# Patient Record
Sex: Male | Born: 1990 | Race: White | Hispanic: No | Marital: Single
Health system: Southern US, Community
[De-identification: ages and names within clinical notes are randomized; demographics above are authoritative.]

## PROBLEM LIST (undated history)

## (undated) DIAGNOSIS — F909 Attention-deficit hyperactivity disorder, unspecified type: Secondary | ICD-10-CM

---

## 2017-12-19 ENCOUNTER — Encounter: Payer: Self-pay | Admitting: Cardiology

## 2017-12-19 ENCOUNTER — Ambulatory Visit (INDEPENDENT_AMBULATORY_CARE_PROVIDER_SITE_OTHER): Payer: Managed Care, Other (non HMO) | Admitting: Cardiology

## 2017-12-19 VITALS — BP 132/78 | HR 81 | Ht 72.0 in | Wt 208.0 lb

## 2017-12-19 DIAGNOSIS — R55 Syncope and collapse: Secondary | ICD-10-CM

## 2017-12-19 DIAGNOSIS — R079 Chest pain, unspecified: Secondary | ICD-10-CM | POA: Diagnosis not present

## 2017-12-19 DIAGNOSIS — Z01812 Encounter for preprocedural laboratory examination: Secondary | ICD-10-CM

## 2017-12-19 MED ORDER — METOPROLOL TARTRATE 50 MG PO TABS: ORAL_TABLET | ORAL | 0 refills | Status: DC

## 2017-12-19 NOTE — Progress Notes (Signed)
Cardiology Office Note:    Date:  12/19/2017   ID:  Jeff Mayo, DOB Sep 08, 1990, MRN [id number]  PCP:  Nechama Guard, FNP  Cardiologist:  Jodelle Red, MD PhD  Referring MD: Nechama Guard, FNP   Chief Complaint  Patient presents with  . Chest Pain    History of Present Illness:    Jeff Mayo is a 27 y.o. male with a hx of palpitations who is seen as a new consult at the request of Nechama Guard, FNP for the evaluation and management of palpitations/chest pain.  Patient history: endorses an episode of syncope concerning for cardiac nature. Was at basic training for the [employer], was running outside in [place] in July 2016. After running for about 28 minutes, he started having arm pain, then chest pain. Shortly after he collapsed. He does not know how long he was out. He landed on his face on the pavement. The first thing he remembers was someone rolling him onto his back to start CPR, but he had a pulse. Does not think he received CPR or defibrillation. He was transported to several hospitals in [place] but does not think he had any major testing at the time. Never had a heart cath. Has been unable to get records, was never told what the cause was. Several months later, he had a private cardiologist perform a stress test and echo, was told it was normal. Has never had a similar episode before or since. No family history of sudden cardiac death or syncope. Has no family history of MI, heart disease, or CVA.  Current concerns: gets chest pain, feels like "a burp in my heart." Happens frequently, nonexertional. Not associated with nausea, vomiting, diaphoresis. Sometimes has left side pain with activity, sharp, radiates around rib cage. Recently, has been coughing up green phlegm. Has been undergoing workup for acid reflux. Also has sensation that all of the blood in his body is hot (has been chronic) and that sometimes he feels like there is something moving up and down in the  veins in his upper arms.  History reviewed. No pertinent past medical history.  History reviewed. No pertinent surgical history.  Current Medications: Current Outpatient Medications on File Prior to Visit  Medication Sig  . cetirizine (ZYRTEC) 10 MG tablet Take 10 mg by mouth daily.  Marland Kitchen omeprazole (PRILOSEC) 40 MG capsule Take 40 mg by mouth daily.   No current facility-administered medications on file prior to visit.      Allergies:   Penicillins   Social History   Socioeconomic History  . Marital status: Single    Spouse name: Not on file  . Number of children: Not on file  . Years of education: Not on file  . Highest education level: Not on file  Occupational History  . Not on file  Social Needs  . Financial resource strain: Not on file  . Food insecurity:    Worry: Not on file    Inability: Not on file  . Transportation needs:    Medical: Not on file    Non-medical: Not on file  Tobacco Use  . Smoking status: Never Smoker  . Smokeless tobacco: Never Used  Substance and Sexual Activity  . Alcohol use: Never    Frequency: Never  . Drug use: Never  . Sexual activity: Not on file  Lifestyle  . Physical activity:    Days per week: Not on file    Minutes per session: Not on file  .  Stress: Not on file  Relationships  . Social connections:    Talks on phone: Not on file    Gets together: Not on file    Attends religious service: Not on file    Active member of club or organization: Not on file    Attends meetings of clubs or organizations: Not on file    Relationship status: Not on file  Other Topics Concern  . Not on file  Social History Narrative  . 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 Denies tobacco or illicits. Going to school for engineering now.  Family History: The patient's family history includes Bipolar disorder in his father; Diabetes in his other. No significant cardiac  history.  ROS:   Please see the history of present illness.  Additional pertinent ROS: Review of Systems  Constitutional: Negative for chills, diaphoresis, fever and malaise/fatigue.  HENT: Negative for ear pain and hearing loss.   Eyes: Negative for blurred vision and photophobia.  Respiratory: Positive for sputum production. Negative for hemoptysis and shortness of breath.   Cardiovascular: Positive for chest pain. Negative for palpitations, orthopnea, claudication, leg swelling and PND.  Gastrointestinal: Positive for abdominal pain, blood in stool and constipation. Negative for melena.       Hemorrhoids  Genitourinary: Negative for dysuria and hematuria.  Musculoskeletal: Negative for falls and myalgias.  Skin: Negative for itching and rash.  Neurological: Negative for focal weakness and loss of consciousness.  Endo/Heme/Allergies: Does not bruise/bleed easily.    EKGs/Labs/Other Studies Reviewed:    The following studies were reviewed today: Prior notes, ECG  EKG:  EKG is ordered today.  The ekg ordered today demonstrates normal sinus rhythm  Recent Labs: No results found for requested labs within last 8760 hours.  Recent Lipid Panel No results found for: CHOL, TRIG, HDL, CHOLHDL, VLDL, LDLCALC, LDLDIRECT  Physical Exam:    VS:  BP 132/78   Pulse 81   Ht 6' (1.829 m)   Wt 208 lb (94.3 kg)   BMI 28.21 kg/m     Wt Readings from Last 3 Encounters:  12/19/17 208 lb (94.3 kg)     GEN: Well nourished, well developed in no acute distress HEENT: Normal NECK: No JVD; No carotid bruits LYMPHATICS: No lymphadenopathy CARDIAC: regular rhythm, normal S1 and S2, no murmurs, rubs, gallops. Radial and DP pulses 2+ bilaterally. RESPIRATORY:  Clear to auscultation without rales, wheezing or rhonchi  ABDOMEN: Soft, non-tender, non-distended MUSCULOSKELETAL:  No edema; No deformity  SKIN: Warm and dry NEUROLOGIC:  Alert and oriented x 3 PSYCHIATRIC:  Normal affect    ASSESSMENT:    1. Chest pain, unspecified type   2. Syncope, unspecified syncope type   3. Pre-operative laboratory examination    PLAN:    1. History of syncope after chest pain, recurrent chest pain/discomfort Current symptoms are much different than prior, but his presentation is extremely concerning. Would not expect severe chest/arm pain leading to syncope, even in a setting that could lead to dehydration, etc. He has never been told what the etiology of his episode was. -ECG today unremarkable. Concern is for congenital/structural disease, anomalous coronary, etc as a cause of syncope. Will order echo to evaluate for structural heart disease, coronary CTA to evaluate for cause of chest pain (dissection, anomalous course, etc). -if initial workup unrevealing, would consider cardiac MRI to look for scar. Treadmill stress may also show any abnormal changes  in electrical rhythm with exercise  While he did not require CPR/defibrillation (to his knowledge), this is a concerning syncopal event. Not technically sudden cardiac death. No clear risk factors or family history. He will try to get records from the [employer] but has been unsuccessful to this point.  Plan for follow up: 3-4 months or sooner depending on results of testing  Medication Adjustments/Labs and Tests Ordered: Current medicines are reviewed at length with the patient today.  Concerns regarding medicines are outlined above.  Orders Placed This Encounter  Procedures  . CT CORONARY MORPH W/CTA COR W/SCORE W/CA W/CM &/OR WO/CM  . CT CORONARY FRACTIONAL FLOW RESERVE DATA PREP  . CT CORONARY FRACTIONAL FLOW RESERVE FLUID ANALYSIS  . Basic metabolic panel  . EKG 12-Lead  . ECHOCARDIOGRAM COMPLETE   Meds ordered this encounter  Medications  . metoprolol tartrate (LOPRESSOR) 50 MG tablet    Sig: TAKE 1 TABLET 1 HR PRIOR TO CARDIAC PROCEDURE    Dispense:  1 tablet    Refill:  0    Patient Instructions  Medication  Instructions: Your physician recommends that you continue on your current medications as directed.    If you need a refill on your cardiac medications before your next appointment, please call your pharmacy.   Labwork: Your physician recommends that you return for lab work 1 week prior to test. BMP   Procedures/Testing: Your physician has requested that you have an echocardiogram. Echocardiography is a painless test that uses sound waves to create images of your heart. It provides your doctor with information about the size and shape of your heart and how well your heart's chambers and valves are working. This procedure takes approximately one hour. There are no restrictions for this procedure. [address]  Your physician has requested that you have cardiac CT. Cardiac computed tomography (CT) is a painless test that uses an x-ray machine to take clear, detailed pictures of your heart. For further information please visit [email or web address]. Please follow instruction sheet as given. [facility]     Follow-Up: Your physician wants you to follow-up in 3-4 months with Dr. Cristal Deerhristopher.  Special Instructions:    Thank you for choosing [facility] at [facility]!!    Please arrive at the [facility] main entrance of [facility] at xx:xx AM (30-45 minutes prior to test start time)  [facility] [address] [place], [place] [address] [phone]  Proceed to the [facility] Radiology Department (First Floor).  Please follow these instructions carefully (unless otherwise directed):   On the Night Before the Test: . Drink plenty of water. . Do not consume any caffeinated/decaffeinated beverages or chocolate 12 hours prior to your test. . Do not take any antihistamines 12 hours prior to your test.  On the Day of the Test: . Drink plenty of water. Do not drink any water within one hour of the test. . Do not eat any food 4 hours  prior to the test. . You may take your regular medications prior to the test. . IF NOT ON A BETA BLOCKER - Take 50 mg of lopressor (metoprolol) one hour before the test.  After the Test: . Drink plenty of water. . After receiving IV contrast, you may experience a mild flushed feeling. This is normal. . On occasion, you may experience a mild rash up to 24 hours after the test. This is not dangerous. If this occurs, you can take Benadryl 25 mg and  increase your fluid intake. . If you experience trouble breathing, this can be serious. If it is severe call 911 IMMEDIATELY. If it is mild, please call our office.      Signed, Jodelle Red, MD PhD 12/19/2017 5:42 PM    [facility]

## 2017-12-19 NOTE — Patient Instructions (Signed)
Medication Instructions: Your physician recommends that you continue on your current medications as directed.    If you need a refill on your cardiac medications before your next appointment, please call your pharmacy.   Labwork: Your physician recommends that you return for lab work 1 week prior to test. BMP   Procedures/Testing: Your physician has requested that you have an echocardiogram. Echocardiography is a painless test that uses sound waves to create images of your heart. It provides your doctor with information about the size and shape of your heart and how well your heart's chambers and valves are working. This procedure takes approximately one hour. There are no restrictions for this procedure. [address]  Your physician has requested that you have cardiac CT. Cardiac computed tomography (CT) is a painless test that uses an x-ray machine to take clear, detailed pictures of your heart. For further information please visit [email or web address]. Please follow instruction sheet as given. [facility]     Follow-Up: Your physician wants you to follow-up in 3-4 months with Dr. Cristal Deerhristopher.  Special Instructions:    Thank you for choosing [facility] at [facility]!!    Please arrive at the [facility] main entrance of [facility] at xx:xx AM (30-45 minutes prior to test start time)  [facility] [address] [place], [place] [address] [phone]  Proceed to the [facility] Radiology Department (First Floor).  Please follow these instructions carefully (unless otherwise directed):   On the Night Before the Test: . Drink plenty of water. . Do not consume any caffeinated/decaffeinated beverages or chocolate 12 hours prior to your test. . Do not take any antihistamines 12 hours prior to your test.  On the Day of the Test: . Drink plenty of water. Do not drink any water within one hour of the test. . Do not eat any food  4 hours prior to the test. . You may take your regular medications prior to the test. . IF NOT ON A BETA BLOCKER - Take 50 mg of lopressor (metoprolol) one hour before the test.  After the Test: . Drink plenty of water. . After receiving IV contrast, you may experience a mild flushed feeling. This is normal. . On occasion, you may experience a mild rash up to 24 hours after the test. This is not dangerous. If this occurs, you can take Benadryl 25 mg and increase your fluid intake. . If you experience trouble breathing, this can be serious. If it is severe call 911 IMMEDIATELY. If it is mild, please call our office.

## 2017-12-28 ENCOUNTER — Other Ambulatory Visit (HOSPITAL_COMMUNITY): Payer: Managed Care, Other (non HMO)

## 2018-01-04 ENCOUNTER — Ambulatory Visit (HOSPITAL_COMMUNITY): Payer: Managed Care, Other (non HMO) | Attending: Cardiology

## 2018-01-04 ENCOUNTER — Other Ambulatory Visit: Payer: Self-pay

## 2018-01-04 DIAGNOSIS — R55 Syncope and collapse: Secondary | ICD-10-CM

## 2018-01-04 DIAGNOSIS — R079 Chest pain, unspecified: Secondary | ICD-10-CM

## 2018-01-10 ENCOUNTER — Telehealth: Payer: Self-pay | Admitting: Cardiology

## 2018-01-10 NOTE — Telephone Encounter (Signed)
New message    Please call patient with echo results thank you

## 2018-01-10 NOTE — Telephone Encounter (Signed)
Patient aware of echo results and verbalized understanding.  

## 2018-02-01 ENCOUNTER — Ambulatory Visit (HOSPITAL_COMMUNITY): Payer: Managed Care, Other (non HMO)

## 2018-02-22 ENCOUNTER — Ambulatory Visit (HOSPITAL_COMMUNITY): Admission: RE | Admit: 2018-02-22 | Payer: Managed Care, Other (non HMO) | Source: Ambulatory Visit

## 2018-02-22 ENCOUNTER — Ambulatory Visit (HOSPITAL_COMMUNITY): Payer: Managed Care, Other (non HMO) | Attending: Cardiology

## 2018-03-14 ENCOUNTER — Ambulatory Visit (HOSPITAL_COMMUNITY): Payer: Managed Care, Other (non HMO)

## 2018-03-15 ENCOUNTER — Ambulatory Visit: Payer: Managed Care, Other (non HMO) | Admitting: Cardiology

## 2018-03-28 ENCOUNTER — Ambulatory Visit (HOSPITAL_COMMUNITY): Admission: RE | Admit: 2018-03-28 | Payer: Managed Care, Other (non HMO) | Source: Ambulatory Visit

## 2018-03-28 ENCOUNTER — Ambulatory Visit (HOSPITAL_COMMUNITY)
Admission: RE | Admit: 2018-03-28 | Discharge: 2018-03-28 | Disposition: A | Discharge status: Home / Self Care | Payer: Managed Care, Other (non HMO) | Source: Ambulatory Visit | Attending: Cardiology | Admitting: Cardiology

## 2018-03-28 DIAGNOSIS — R55 Syncope and collapse: Secondary | ICD-10-CM | POA: Diagnosis present

## 2018-03-28 DIAGNOSIS — R079 Chest pain, unspecified: Secondary | ICD-10-CM | POA: Insufficient documentation

## 2018-03-28 MED ORDER — IOPAMIDOL (ISOVUE-370) INJECTION 76%
100.0000 mL | Freq: Once | INTRAVENOUS | Status: AC | PRN
  Administered 2018-03-28: 100 mL via INTRAVENOUS

## 2018-03-28 MED ORDER — METOPROLOL TARTRATE 5 MG/5ML IV SOLN
5.0000 mg | INTRAVENOUS | Status: DC | PRN
  Administered 2018-03-28: 5 mg via INTRAVENOUS
  Filled 2018-03-28: qty 5

## 2018-03-28 MED ORDER — METOPROLOL TARTRATE 5 MG/5ML IV SOLN
INTRAVENOUS | Status: AC
  Administered 2018-03-28: 5 mg via INTRAVENOUS
  Filled 2018-03-28: qty 20

## 2018-03-28 MED ORDER — NITROGLYCERIN 0.4 MG SL SUBL
0.8000 mg | SUBLINGUAL_TABLET | Freq: Once | SUBLINGUAL | Status: AC
  Administered 2018-03-28: 0.8 mg via SUBLINGUAL
  Filled 2018-03-28: qty 25

## 2018-03-28 MED ORDER — NITROGLYCERIN 0.4 MG SL SUBL
SUBLINGUAL_TABLET | SUBLINGUAL | Status: AC
  Administered 2018-03-28: 0.8 mg via SUBLINGUAL
  Filled 2018-03-28: qty 1

## 2018-04-04 ENCOUNTER — Encounter: Payer: Self-pay | Admitting: Cardiology

## 2018-04-04 ENCOUNTER — Ambulatory Visit (INDEPENDENT_AMBULATORY_CARE_PROVIDER_SITE_OTHER): Payer: Managed Care, Other (non HMO) | Admitting: Cardiology

## 2018-04-04 VITALS — BP 100/70 | HR 68 | Ht 72.0 in | Wt 205.8 lb

## 2018-04-04 DIAGNOSIS — Z712 Person consulting for explanation of examination or test findings: Secondary | ICD-10-CM

## 2018-04-04 DIAGNOSIS — R0789 Other chest pain: Secondary | ICD-10-CM

## 2018-04-04 NOTE — Patient Instructions (Signed)
Medication Instructions:  Your physician recommends that you continue on your current medications as directed. Please refer to the Current Medication list given to you today.  If you need a refill on your cardiac medications before your next appointment, please call your pharmacy.   Lab work: None ordered  If you have labs (blood work) drawn today and your tests are completely normal, you will receive your results only by: . MyChart Message (if you have MyChart) OR . A paper copy in the mail If you have any lab test that is abnormal or we need to change your treatment, we will call you to review the results.  Testing/Procedures: None ordered   Follow-Up: At [facility], you and your health needs are our priority.  As part of our continuing mission to provide you with exceptional heart care, we have created designated Provider Care Teams.  These Care Teams include your primary Cardiologist (physician) and Advanced Practice Providers (APPs -  Physician Assistants and Nurse Practitioners) who all work together to provide you with the care you need, when you need it. You will need a follow up appointment as needed.  

## 2018-04-04 NOTE — Progress Notes (Signed)
Cardiology Office Note:    Date:  04/04/2018   ID:  Jeff Mayo, DOB 1990-05-03, MRN [id number]  PCP:  Nechama Guard, FNP  Cardiologist:  Jodelle Red, MD PhD  Referring MD: Nechama Guard, FNP   CC: follow up on test results  History of Present Illness:    Jeff Mayo is a 27 y.o. male with a hx of palpitations, syncope during basic training who is seen in follow up for the evaluation and management of palpitations/chest pain. My initial consult with him was on 12/19/17.   Cardiac history: endorses an episode of syncope concerning for cardiac nature. Was at basic training for the [employer], was running outside in [place] in July 2016. After running for about 28 minutes, he started having arm pain, then chest pain. Shortly after he collapsed. He does not know how long he was out. He landed on his face on the pavement. The first thing he remembers was someone rolling him onto his back to start CPR, but he had a pulse. Does not think he received CPR or defibrillation. He was transported to several hospitals in [place] but does not think he had any major testing at the time. Never had a heart cath. Has been unable to get records, was never told what the cause was. Several months later, he had a private cardiologist perform a stress test and echo, was told it was normal. Has never had a similar episode before or since. No family history of sudden cardiac death or syncope. Has no family history of MI, heart disease, or CVA.  Today: we reviewed the results of his cardiac testing, including echo and mostly coronary CTA today. Excellent results. He does still continue to have atypical chest discomfort, sometimes sharp pain. Had an episode recently when he was drinking a liquid and had chest pain and pain down to his leg. Discussed alternative causes for chest pain beyond the heart.  Current Medications: Current Outpatient Medications on File Prior to Visit  Medication Sig  .  cetirizine (ZYRTEC) 10 MG tablet Take 10 mg by mouth daily.  Marland Kitchen omeprazole (PRILOSEC) 40 MG capsule Take 40 mg by mouth daily.   No current facility-administered medications on file prior to visit.      Allergies:   Penicillins   Social History   Socioeconomic History  . Marital status: Single    Spouse name: Not on file  . Number of children: Not on file  . Years of education: Not on file  . Highest education level: Not on file  Occupational History  . Not on file  Social Needs  . Financial resource strain: Not on file  . Food insecurity:    Worry: Not on file    Inability: Not on file  . Transportation needs:    Medical: Not on file    Non-medical: Not on file  Tobacco Use  . Smoking status: Never Smoker  . Smokeless tobacco: Never Used  Substance and Sexual Activity  . Alcohol use: Never    Frequency: Never  . Drug use: Never  . Sexual activity: Not on file  Lifestyle  . Physical activity:    Days per week: Not on file    Minutes per session: Not on file  . Stress: Not on file  Relationships  . Social connections:    Talks on phone: Not on file    Gets together: Not on file    Attends religious service: Not on file    Active  member of club or organization: Not on file    Attends meetings of clubs or organizations: Not on file    Relationship status: Not on file  Other Topics Concern  . Not on file  Social History Narrative  . 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 Denies tobacco or illicits. Going to school for engineering now.  Family History: The patient's family history includes Bipolar disorder in his father; Diabetes in an other family member. No significant cardiac history.  ROS:   Reviewed, pertinent positives in HPI  EKGs/Labs/Other Studies Reviewed:    The following studies were reviewed today: Prior notes, ECG  EKG:  EKG is personally reviewed today.  The ekg ordered  previously demonstrates normal sinus rhythm  Recent Labs: No results found for requested labs within last 8760 hours.  Recent Lipid Panel No results found for: CHOL, TRIG, HDL, CHOLHDL, VLDL, LDLCALC, LDLDIRECT  Physical Exam:    VS:  BP 100/70   Pulse 68   Ht 6' (1.829 m)   Wt 205 lb 12.8 oz (93.4 kg)   SpO2 98%   BMI 27.91 kg/m     Wt Readings from Last 3 Encounters:  04/04/18 205 lb 12.8 oz (93.4 kg)  12/19/17 208 lb (94.3 kg)    No additional physical exam today  ASSESSMENT:    1. Encounter to discuss test results   2. Other chest pain    PLAN:    1. History of syncope after chest pain, recurrent chest pain/discomfort -reassuring echo, normal CTA without any anomalies, no bridging, etc. No evidence of CAD. Continues to have atypical pain.  Given his very reassuring workup to this point, I do not think he needs further evaluation at this time. No further high risk syncope. Counseled that if he does have recurrent symptoms, we can review and discuss if any additional workup is necessary. He is welcome to follow up with [place] as needed.  TIME SPENT WITH PATIENT: 15 minutes of direct patient care. More than 50% of that time was spent on coordination of care and counseling regarding review of test results, discussion of management and monitoring.  Jodelle Red, MD, PhD [place]  [facility]   Medication Adjustments/Labs and Tests Ordered: Current medicines are reviewed at length with the patient today.  Concerns regarding medicines are outlined above.  No orders of the defined types were placed in this encounter.  No orders of the defined types were placed in this encounter.   Patient Instructions  Medication Instructions:  Your physician recommends that you continue on your current medications as directed. Please refer to the Current Medication list given to you today.  If you need a refill on your cardiac medications before your next appointment,  please call your pharmacy.   Lab work: None ordered If you have labs (blood work) drawn today and your tests are completely normal, you will receive your results only by: Marland Kitchen MyChart Message (if you have MyChart) OR . A paper copy in the mail If you have any lab test that is abnormal or we need to change your treatment, we will call you to review the results.  Testing/Procedures: None ordered  Follow-Up: At [facility], you and your health needs are our priority.  As part of our continuing mission to provide you with exceptional heart care, we have created designated Provider Care Teams.  These Care Teams include your primary Cardiologist (  physician) and Advanced Practice Providers (APPs -  Physician Assistants and Nurse Practitioners) who all work together to provide you with the care you need, when you need it. You will need a follow up appointment as needed        Signed, Jodelle RedBridgette Arieh Bogue, MD PhD 04/04/2018 9:45 AM    [facility]

## 2018-05-23 ENCOUNTER — Other Ambulatory Visit: Payer: Self-pay

## 2018-05-23 ENCOUNTER — Emergency Department (HOSPITAL_COMMUNITY)
Admission: EM | Admit: 2018-05-23 | Discharge: 2018-05-23 | Disposition: A | Discharge status: Home / Self Care | Payer: Managed Care, Other (non HMO) | Attending: Emergency Medicine | Admitting: Emergency Medicine

## 2018-05-23 ENCOUNTER — Encounter (HOSPITAL_COMMUNITY): Payer: Self-pay | Admitting: Emergency Medicine

## 2018-05-23 ENCOUNTER — Emergency Department (HOSPITAL_COMMUNITY): Payer: Managed Care, Other (non HMO)

## 2018-05-23 DIAGNOSIS — Z79899 Other long term (current) drug therapy: Secondary | ICD-10-CM | POA: Diagnosis not present

## 2018-05-23 DIAGNOSIS — R112 Nausea with vomiting, unspecified: Secondary | ICD-10-CM | POA: Insufficient documentation

## 2018-05-23 DIAGNOSIS — R197 Diarrhea, unspecified: Secondary | ICD-10-CM | POA: Insufficient documentation

## 2018-05-23 DIAGNOSIS — F909 Attention-deficit hyperactivity disorder, unspecified type: Secondary | ICD-10-CM | POA: Insufficient documentation

## 2018-05-23 DIAGNOSIS — R109 Unspecified abdominal pain: Secondary | ICD-10-CM | POA: Diagnosis not present

## 2018-05-23 HISTORY — DX: Attention-deficit hyperactivity disorder, unspecified type: F90.9

## 2018-05-23 LAB — CBC
HCT: 50.6 % (ref 39.0–52.0)
Hemoglobin: 16.2 g/dL (ref 13.0–17.0)
MCH: 26.8 pg (ref 26.0–34.0)
MCHC: 32 g/dL (ref 30.0–36.0)
MCV: 83.8 fL (ref 80.0–100.0)
Platelets: 311 10*3/uL (ref 150–400)
RBC: 6.04 MIL/uL — ABNORMAL HIGH (ref 4.22–5.81)
RDW: 13 % (ref 11.5–15.5)
WBC: 11.3 10*3/uL — ABNORMAL HIGH (ref 4.0–10.5)
nRBC: 0 % (ref 0.0–0.2)

## 2018-05-23 LAB — COMPREHENSIVE METABOLIC PANEL
ALT: 38 U/L (ref 0–44)
ANION GAP: 10 (ref 5–15)
AST: 27 U/L (ref 15–41)
Albumin: 4.6 g/dL (ref 3.5–5.0)
Alkaline Phosphatase: 80 U/L (ref 38–126)
BUN: 17 mg/dL (ref 6–20)
CO2: 24 mmol/L (ref 22–32)
Calcium: 9.1 mg/dL (ref 8.9–10.3)
Chloride: 105 mmol/L (ref 98–111)
Creatinine, Ser: 1.04 mg/dL (ref 0.61–1.24)
GFR calc Af Amer: >60 mL/min (ref >60)
GFR calc non Af Amer: >60 mL/min (ref >60)
GLUCOSE: 118 mg/dL — AB (ref 70–99)
Potassium: 3.9 mmol/L (ref 3.5–5.1)
Sodium: 139 mmol/L (ref 135–145)
Total Bilirubin: 0.8 mg/dL (ref 0.3–1.2)
Total Protein: 8.1 g/dL (ref 6.5–8.1)

## 2018-05-23 LAB — URINALYSIS, ROUTINE W REFLEX MICROSCOPIC
Bilirubin Urine: NEGATIVE
Glucose, UA: NEGATIVE mg/dL
Hgb urine dipstick: NEGATIVE
Ketones, ur: NEGATIVE mg/dL
Leukocytes, UA: NEGATIVE
Nitrite: NEGATIVE
Protein, ur: NEGATIVE mg/dL
pH: 5 (ref 5.0–8.0)

## 2018-05-23 LAB — LIPASE, BLOOD: Lipase: 25 U/L (ref 11–51)

## 2018-05-23 MED ORDER — SODIUM CHLORIDE 0.9 % IV BOLUS
1000.0000 mL | Freq: Once | INTRAVENOUS | Status: AC
  Administered 2018-05-23: 1000 mL via INTRAVENOUS

## 2018-05-23 MED ORDER — METOCLOPRAMIDE HCL 5 MG/ML IJ SOLN
10.0000 mg | Freq: Once | INTRAMUSCULAR | Status: AC
  Administered 2018-05-23: 10 mg via INTRAVENOUS
  Filled 2018-05-23: qty 2

## 2018-05-23 MED ORDER — IOPAMIDOL (ISOVUE-300) INJECTION 61%
100.0000 mL | Freq: Once | INTRAVENOUS | Status: AC | PRN
  Administered 2018-05-23: 100 mL via INTRAVENOUS

## 2018-05-23 MED ORDER — ONDANSETRON HCL 4 MG/2ML IJ SOLN
4.0000 mg | Freq: Once | INTRAMUSCULAR | Status: AC
  Administered 2018-05-23: 4 mg via INTRAVENOUS
  Filled 2018-05-23: qty 2

## 2018-05-23 MED ORDER — SODIUM CHLORIDE 0.9% FLUSH
3.0000 mL | Freq: Once | INTRAVENOUS | Status: AC
  Administered 2018-05-23: 3 mL via INTRAVENOUS

## 2018-05-23 MED ORDER — FAMOTIDINE IN NACL 20-0.9 MG/50ML-% IV SOLN
20.0000 mg | Freq: Once | INTRAVENOUS | Status: AC
  Administered 2018-05-23: 20 mg via INTRAVENOUS
  Filled 2018-05-23: qty 50

## 2018-05-23 MED ORDER — ALUM & MAG HYDROXIDE-SIMETH 200-200-20 MG/5ML PO SUSP
30.0000 mL | Freq: Once | ORAL | Status: AC
  Administered 2018-05-23: 30 mL via ORAL
  Filled 2018-05-23: qty 30

## 2018-05-23 MED ORDER — PANTOPRAZOLE SODIUM 20 MG PO TBEC: 20.0000 mg | DELAYED_RELEASE_TABLET | Freq: Every day | ORAL | 0 refills | Status: AC

## 2018-05-23 MED ORDER — ONDANSETRON HCL 4 MG PO TABS: 4.0000 mg | ORAL_TABLET | Freq: Three times a day (TID) | ORAL | 0 refills | Status: AC | PRN

## 2018-05-23 MED ORDER — LIDOCAINE VISCOUS HCL 2 % MT SOLN
15.0000 mL | Freq: Once | OROMUCOSAL | Status: AC
  Administered 2018-05-23: 15 mL via ORAL
  Filled 2018-05-23: qty 15

## 2018-05-23 MED ORDER — MORPHINE SULFATE (PF) 2 MG/ML IV SOLN
2.0000 mg | Freq: Once | INTRAVENOUS | Status: AC
  Administered 2018-05-23: 2 mg via INTRAVENOUS
  Filled 2018-05-23: qty 1

## 2018-05-23 NOTE — ED Notes (Signed)
Pt given ginger ale and crackers. Pt tolerating PO at his time.

## 2018-05-23 NOTE — ED Notes (Signed)
Pt in bathroom

## 2018-05-23 NOTE — Discharge Instructions (Signed)
Take Protonix daily.  Try this for the next 2 weeks and reassess your symptoms. Use Zofran as needed for nausea or vomiting.  If you are having pain, use Tylenol.  Avoid medicines like ibuprofen or Motrin. Be careful with your food choices over the neck several days.  Avoid spicy, greasy, acidic foods. Make sure you are staying well-hydrated water. Call the stomach doctor's office to set up follow up appointment.  Return to the emergency room if you develop persistent vomiting despite medication, severe worsening pain, or any new, worsening, or concerning symptoms.

## 2018-05-23 NOTE — ED Notes (Signed)
Patient verbalizes understanding of discharge instructions. Opportunity for questioning and answers were provided. Armband removed by staff, pt discharged from ED.  

## 2018-05-23 NOTE — ED Triage Notes (Signed)
C/o abd cramping that started at 4am with nausea, vomiting, and diarrhea.  Pain worse on R side.

## 2018-05-23 NOTE — ED Provider Notes (Signed)
[facility]   CSN: [id number] Arrival date & time: 05/23/18  0600     History   Chief Complaint Chief Complaint  Patient presents with  . Abdominal Pain    HPI Jeff Mayo is a 28 y.o. male presenting for evaluation of n/v/d and abd pain.   Patient presenting for evaluation of nausea, vomiting, abdominal pain.  Symptoms began 2 days ago.  He reports abdominal pain is mostly epigastric and on the R side, worse with movement and PO.  Chest pain he denies fevers, chills, shortness of breath, cough, urinary symptoms.  He denies sick contacts.  He denies recent travel.  Patient states chest pain radiates to his chest.  Dull pain is described as a cramping which is constant, but waxing and waning in severity.  He has not taken anything for his symptoms including Tylenol or ibuprofen.  Nothing makes the pain better.  He denies history of abdominal problems or abdominal surgeries.  Patient reports 2 episodes of diarrhea today, no blood in his stool.  Reports 10-15 episodes of emesis today, no hematemesis.  Additional history obtained from chart review.  Patient just had extensive cardiac work-up (03/2018) which was reassuring.  HPI  Past Medical History:  Diagnosis Date  . ADHD     There are no active problems to display for this patient.   History reviewed. No pertinent surgical history.      Home Medications    Prior to Admission medications   Medication Sig Start Date End Date Taking? Authorizing Provider  calcium carbonate (TUMS - DOSED IN MG ELEMENTAL CALCIUM) 500 MG chewable tablet Chew 1-2 tablets by mouth daily as needed for indigestion or heartburn.   Yes [provider]  ondansetron (ZOFRAN) 4 MG tablet Take 1 tablet (4 mg total) by mouth every 8 (eight) hours as needed for nausea or vomiting. 05/23/18   Howard Patton, PA-C  pantoprazole (PROTONIX) 20 MG tablet Take 1 tablet (20 mg total) by mouth daily.  05/23/18   Averill Winters, PA-C    Family History Family History  Problem Relation Age of Onset  . Bipolar disorder Father   . Diabetes Other     Social History Social History   Tobacco Use  . Smoking status: Never Smoker  . Smokeless tobacco: Never Used  Substance Use Topics  . Alcohol use: Never    Frequency: Never  . Drug use: Never     Allergies   Penicillins   Review of Systems Review of Systems  Gastrointestinal: Positive for abdominal pain, nausea and vomiting.  All other systems reviewed and are negative.    Physical Exam Updated Vital Signs BP 130/84 (BP Location: Right Arm)   Pulse 79   Temp 97.7 F (36.5 C) (Oral)   Resp 17   Ht 6' (1.829 m)   Wt 95.3 kg   SpO2 100%   BMI 28.48 kg/m   Physical Exam Vitals signs and nursing note reviewed.  Constitutional:      General: He is not in acute distress.    Appearance: He is well-developed.     Comments: Appears dehydrated, but nontoxic  HENT:     Head: Normocephalic and atraumatic.     Mouth/Throat:     Mouth: Mucous membranes are dry.  Eyes:     Conjunctiva/sclera: Conjunctivae normal.     Pupils: Pupils are equal, round, and reactive to light.  Neck:     Musculoskeletal: Normal range of motion and  neck supple.  Cardiovascular:     Rate and Rhythm: Normal rate and regular rhythm.     Pulses: Normal pulses.  Pulmonary:     Effort: Pulmonary effort is normal. No respiratory distress.     Breath sounds: Normal breath sounds. No wheezing.  Abdominal:     General: There is no distension.     Palpations: Abdomen is soft. There is no mass.     Tenderness: There is abdominal tenderness. There is no guarding or rebound.     Comments: TTP of generalized abd, worse in RUQ, RLQ and epigastric abd. No rigidity or distention. Negative murphys. Negative rebound  Musculoskeletal: Normal range of motion.  Skin:    General: Skin is warm and dry.     Capillary Refill: Capillary refill takes less than 2  seconds.  Neurological:     Mental Status: He is alert and oriented to person, place, and time.      ED Treatments / Results  Labs (all labs ordered are listed, but only abnormal results are displayed) Labs Reviewed  COMPREHENSIVE METABOLIC PANEL - Abnormal; Notable for the following components:      Result Value   Glucose, Bld 118 (*)    All other components within normal limits  CBC - Abnormal; Notable for the following components:   WBC 11.3 (*)    RBC 6.04 (*)    All other components within normal limits  URINALYSIS, ROUTINE W REFLEX MICROSCOPIC - Abnormal; Notable for the following components:   Specific Gravity, Urine >1.046 (*)    All other components within normal limits  LIPASE, BLOOD    EKG None  Radiology Ct Abdomen Pelvis W Contrast  Result Date: 05/23/2018 CLINICAL DATA:  Acute right-sided abdominal pain. EXAM: CT ABDOMEN AND PELVIS WITH CONTRAST TECHNIQUE: Multidetector CT imaging of the abdomen and pelvis was performed using the standard protocol following bolus administration of intravenous contrast. CONTRAST:  ISOVUE-300 IOPAMIDOL (ISOVUE-300) INJECTION 61% COMPARISON:  None. FINDINGS: Lower chest: No acute abnormality. Hepatobiliary: No focal liver abnormality is seen. No gallstones, gallbladder wall thickening, or biliary dilatation. Pancreas: Unremarkable. No pancreatic ductal dilatation or surrounding inflammatory changes. Spleen: Normal in size without focal abnormality. Adrenals/Urinary Tract: Adrenal glands are unremarkable. Kidneys are normal, without renal calculi, focal lesion, or hydronephrosis. Bladder is unremarkable. Stomach/Bowel: Stomach is within normal limits. Appendix appears normal. No evidence of bowel wall thickening, distention, or inflammatory changes. Vascular/Lymphatic: No significant vascular findings are present. No enlarged abdominal or pelvic lymph nodes. Reproductive: Prostate is unremarkable. Other: No abdominal wall hernia or  abnormality. No abdominopelvic ascites. Musculoskeletal: No acute or significant osseous findings. IMPRESSION: No abnormality seen in the abdomen or pelvis. Electronically Signed   By: Lupita Raider, M.D.   On: 05/23/2018 10:26    Procedures Procedures (including critical care time)  Medications Ordered in ED Medications  sodium chloride flush (NS) 0.9 % injection 3 mL (3 mLs Intravenous Given 05/23/18 0814)  ondansetron (ZOFRAN) injection 4 mg (4 mg Intravenous Given 05/23/18 0814)  sodium chloride 0.9 % bolus 1,000 mL (0 mLs Intravenous Stopped 05/23/18 1007)  morphine 2 MG/ML injection 2 mg (2 mg Intravenous Given 05/23/18 0814)  iopamidol (ISOVUE-300) 61 % injection 100 mL (100 mLs Intravenous Contrast Given 05/23/18 0957)  famotidine (PEPCID) IVPB 20 mg premix (0 mg Intravenous Stopped 05/23/18 1209)  metoCLOPramide (REGLAN) injection 10 mg (10 mg Intravenous Given 05/23/18 1040)  alum & mag hydroxide-simeth (MAALOX/MYLANTA) 200-200-20 MG/5ML suspension 30 mL (30 mLs Oral Given  05/23/18 1251)    And  lidocaine (XYLOCAINE) 2 % viscous mouth solution 15 mL (15 mLs Oral Given 05/23/18 1251)     Initial Impression / Assessment and Plan / ED Course  I have reviewed the triage vital signs and the nursing notes.  Pertinent labs & imaging results that were available during my care of the patient were reviewed by me and considered in my medical decision making (see chart for details).     Patient presenting for evaluation nausea, vomiting, diarrhea, abdominal pain.  Physical exam shows patient who appears dehydrated uncomfortable, but in no acute distress.  Generalized abdominal tenderness, worse on the right side.  As such, increased concern for cholecystitis, appendicitis.  However, also with epigastric abdominal pain, consider gastritis.  Also consider gastroenteritis.  Will give fluids, Zofran, morphine for symptom control.  We will plan on CT abdomen for further evaluation.  Labs show mild  leukocytosis at 11.3.  Otherwise reassuring.  On reassessment, patient reports pain is improved, but still present.  No further nausea or vomiting.  CT pending.  CT reassuring, no intra-abdominal infection, perforation, obstruction, or surgical abdomen.  Patient reports increased pain radiating to his chest.  He is also reporting a metallic taste in his mouth.  Consider GERD/gastritis exacerbated by GI illness.  Low suspicion for cardiac etiology considering age, lack of risk factors, and recent reassuring cardiac work-up.  As such, Zofran and Reglan given.  On reassessment, patient reports pain is improving.  No longer having chest pain.  Will try p.o.  On reassessment, patient reports pain has continued to improve.  Very mild at this time. Will give GI cocktail and d/c with PPI and GI follow up. At this time, pt appears safe for d/c. Return precautions given. Pt states he understands and agrees to plan.   Final Clinical Impressions(s) / ED Diagnoses   Final diagnoses:  Nausea vomiting and diarrhea  Acute abdominal pain    ED Discharge Orders         Ordered    pantoprazole (PROTONIX) 20 MG tablet  Daily     05/23/18 1238    ondansetron (ZOFRAN) 4 MG tablet  Every 8 hours PRN     05/23/18 1240           Croy Drumwright, PA-C 05/23/18 1531    Alvira Monday, MD 05/24/18 1000

## 2019-11-01 ENCOUNTER — Ambulatory Visit: Payer: Self-pay | Attending: Internal Medicine

## 2019-11-01 DIAGNOSIS — Z23 Encounter for immunization: Secondary | ICD-10-CM

## 2019-11-01 NOTE — Progress Notes (Signed)
   Covid-19 Vaccination Clinic  Name:  Thong Feeny    MRN: [id number] DOB: 04-03-1991  11/01/2019  Mr. Talarico was observed post Covid-19 immunization for 15 minutes without incident. He was provided with Vaccine Information Sheet and instruction to access the V-Safe system.   Mr. Jaggi was instructed to call 911 with any severe reactions post vaccine: Marland Kitchen Difficulty breathing  . Swelling of face and throat  . A fast heartbeat  . A bad rash all over body  . Dizziness and weakness   Immunizations Administered    Name Date Dose VIS Date Route   Pfizer COVID-19 Vaccine 11/01/2019 10:39 AM 0.3 mL 06/18/2018 Intramuscular   Manufacturer: [employer], [employer]   Lot: [id number]   NDC: [address]-8

## 2019-11-22 ENCOUNTER — Ambulatory Visit: Payer: Self-pay | Attending: Internal Medicine

## 2019-11-22 DIAGNOSIS — Z23 Encounter for immunization: Secondary | ICD-10-CM

## 2019-11-22 NOTE — Progress Notes (Signed)
   Covid-19 Vaccination Clinic  Name:  Jeff Mayo    MRN: [id number] DOB: [birth year]  11/22/2019  [name] was observed post Covid-19 immunization for 15 minutes without incident. He was provided with Vaccine Information Sheet and instruction to access the V-Safe system.   [name] was instructed to call 911 with any severe reactions post vaccine: Marland Kitchen Difficulty breathing  . Swelling of face and throat  . A fast heartbeat  . A bad rash all over body  . Dizziness and weakness   Immunizations Administered    Name Date Dose VIS Date Route   Pfizer COVID-19 Vaccine 11/22/2019 11:21 AM 0.3 mL 06/18/2018 Intramuscular   Manufacturer: [employer], [employer]   Lot: [id number]   NDC: [address]-0

## 2020-07-14 IMAGING — CT CT HEART MORP W/ CTA COR W/ SCORE W/ CA W/CM &/OR W/O CM
4 of 7 series · 8 of 20 positions shown, 9 images · IV contrast (APPLIED)
Comparison: None.

Addendum:
EXAM:
OVER-READ INTERPRETATION CT CHEST

The following report is an over-read performed by radiologist Dr.
over-read does not include interpretation of cardiac or coronary
anatomy or pathology. The coronary calcium score/coronary CTA
interpretation by the cardiologist is attached.
CLINICAL DATA: 27M with syncope, chest pain and palpitations.
Cardiac/Coronary  CT
TECHNIQUE: The patient was scanned on a [employer] Force scanner.

[Series 6: best diast 75 % · axial · 0.39mm/px · z∈[+1045,+1091]mm · 2 of 346 slices shown, 3 images]
[im 116/346  vessel]
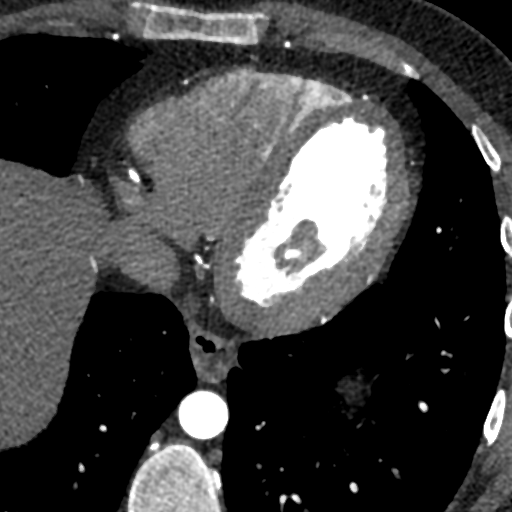
[im 116/346  lung]
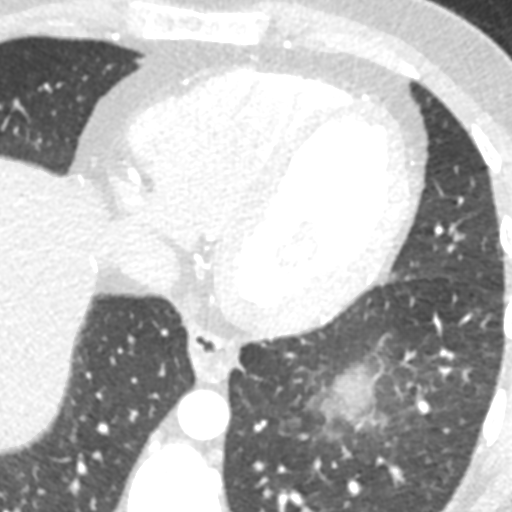
[im 231/346  vessel]
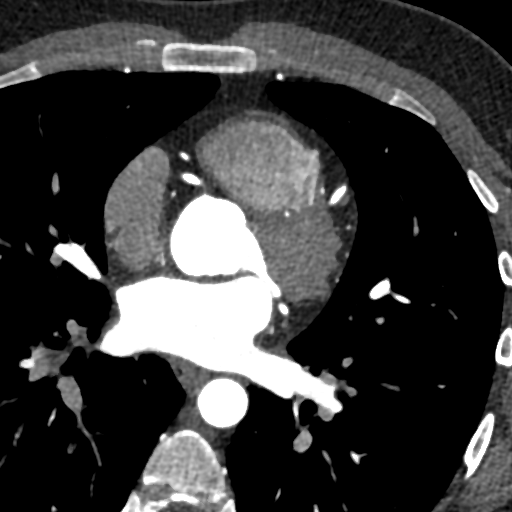

[Series 7: best syst 35 % · axial · 0.39mm/px · z∈[+1045,+1091]mm · 2 of 346 slices shown]
[im 116/346  vessel]
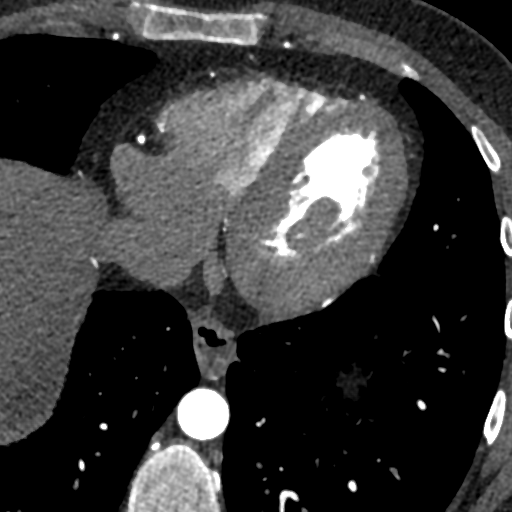
[im 231/346  vessel]
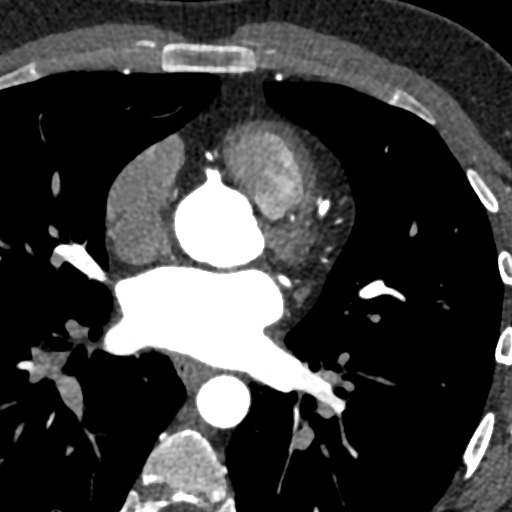

[Series 8: ts diast sharp 75 % · axial · 0.39mm/px · z∈[+1045,+1091]mm · 2 of 346 slices shown]
[im 116/346  lung]
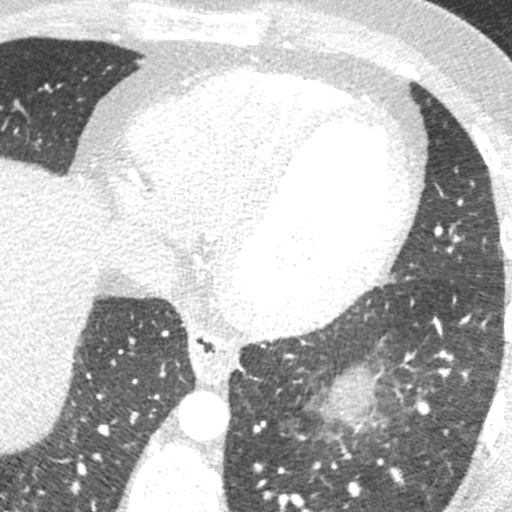
[im 231/346  lung]
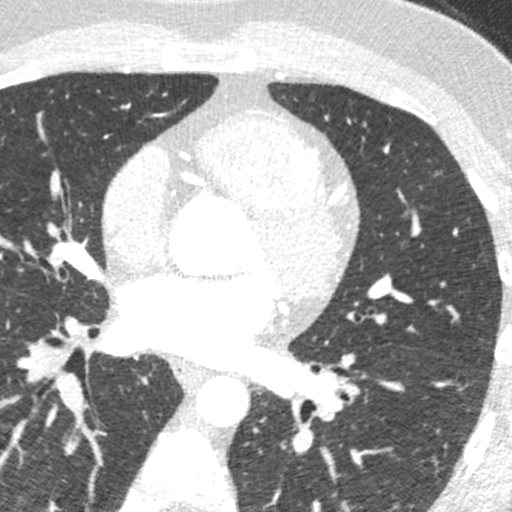

[Series 9: ts syst sharp 35 % · axial · 0.39mm/px · z∈[+1045,+1091]mm · 2 of 346 slices shown]
[im 116/346  lung]
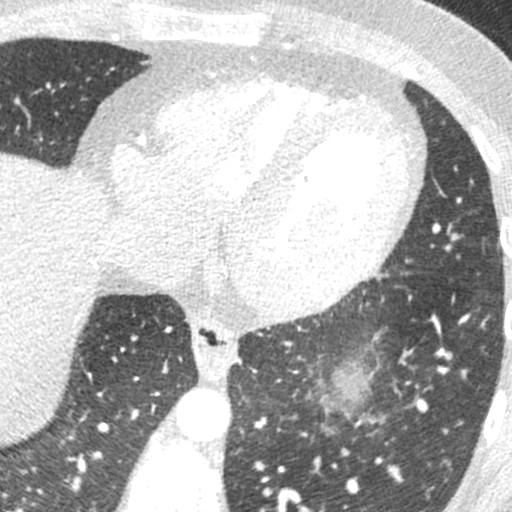
[im 231/346  lung]
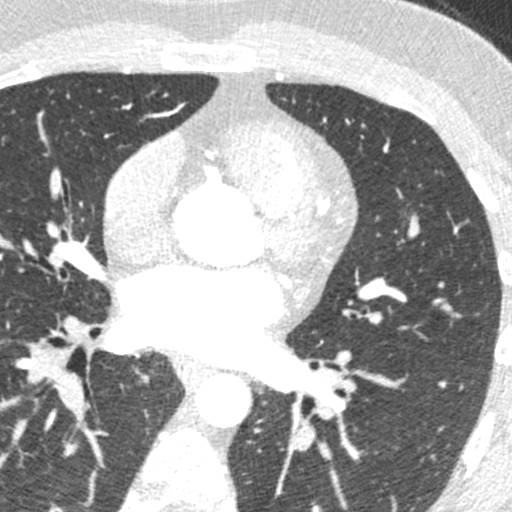

[8 of 20 positions shown; findings below may reference images not displayed]

FINDINGS: Vascular: No visible aortic atherosclerosis. No evidence of aortic
aneurysm.

Mediastinum/Nodes: No pathologic lymphadenopathy within the
visualized mediastinum. Visualized esophagus normal in appearance.

Lungs/Pleura: Minimal scarring in the lingula. Visualized lung
parenchyma otherwise clear. Central bronchi patent without
significant bronchial wall thickening. No pleural effusions.

Upper Abdomen: Multiple discrete low-attenuation lesions throughout
the visualized spleen. This is not felt to be due to the early
arterial phase of enhancement which often causes heterogeneity in
the appearance of the spleen.. Visualized upper abdomen otherwise
unremarkable.

Musculoskeletal: Visualized skeleton unremarkable.
IMPRESSION: 1. Multiple discrete low-attenuation lesions throughout the
visualized spleen (the appearance is not the typical appearance of
heterogeneous early splenic enhancement).. Differential diagnosis
might include sarcoidosis, lymphoma, or multiple small abscesses
(typically seen in in an immunocompromised patient). No pathologic
lymphadenopathy in the visualized chest or upper abdomen to suggest
a diagnosis of lymphoma.
2. Otherwise, no significant extracardiac findings.
FINDINGS: A 120 kV prospective scan was triggered in the descending thoracic
aorta at 111 HU's. Axial non-contrast 3 mm slices were carried out
through the heart. The data set was analyzed on a dedicated work
station and scored using the Agatson method. Gantry rotation speed
was 250 msecs and collimation was .6 mm. No beta blockade and 0.8 mg
of sl NTG was given. The 3D data set was reconstructed in 5%
intervals of the 67-82 % of the R-R cycle. Diastolic phases were
analyzed on a dedicated work station using MPR, MIP and VRT modes.
The patient received 80 cc of contrast.

Aorta: Normal size. Ascending aorta 2.5 cm. No calcifications. No
dissection.

Aortic Valve:  Trileaflet.  No calcifications.

Coronary Arteries:  Normal coronary origin.  Right dominance.

RCA is a large dominant artery that gives rise to PDA and PLVB.
There is no plaque.

Left main is a large artery that gives rise to LAD and LCX arteries.

LAD is a large vessel that has no plaque. There is no plaque in the
diagonal branches.

LCX is a non-dominant artery that gives rise to one large OM1
branch. There is no plaque.

Other findings:

Normal pulmonary vein drainage into the left atrium.

Normal let atrial appendage without a thrombus.

Normal size of the pulmonary artery.
IMPRESSION: 1. Coronary calcium score of 0. This was 0 percentile for age and
sex matched control.

2. Normal coronary origin with right dominance.

3. No evidence of CAD.

*** End of Addendum ***

## 2020-09-08 IMAGING — CT CT ABD-PELV W/ CM
2 of 4 series · 17 of 46 positions shown, 19 images · IV contrast (APPLIED)
Comparison: None.

CLINICAL DATA: Acute right-sided abdominal pain.

EXAM:
CT ABDOMEN AND PELVIS WITH CONTRAST
TECHNIQUE: Multidetector CT imaging of the abdomen and pelvis was performed
using the standard protocol following bolus administration of
intravenous contrast.
CONTRAST:  100mL 316UE4-8MM IOPAMIDOL (316UE4-8MM) INJECTION 61%

[Series 3: abd/ pelvis 5.0 i30f 2 · axial · 0.86mm/px · z∈[+502,+1022]mm · 14 of 114 slices shown, 16 images]
[im 5/114  soft-tissue]
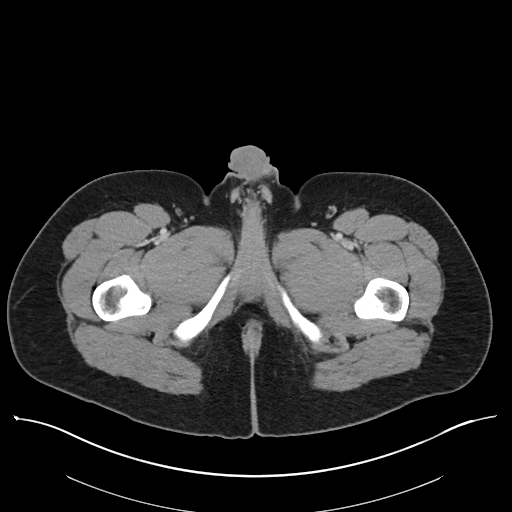
[im 5/114  bone]
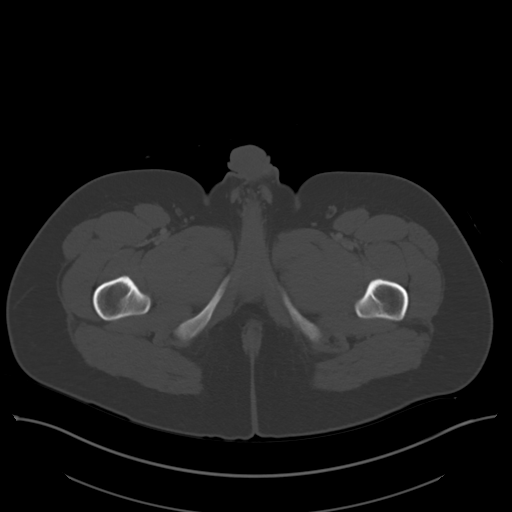
[im 14/114  soft-tissue]
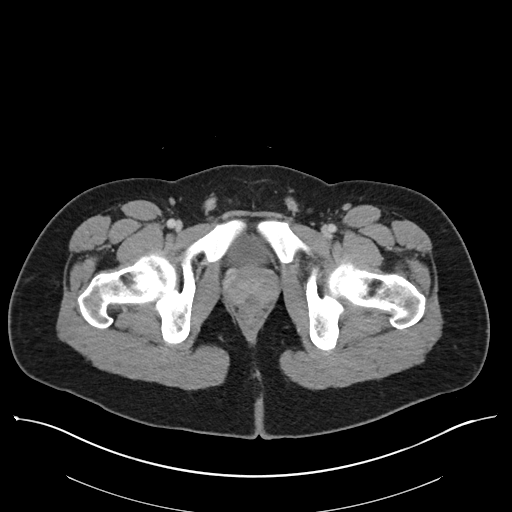
[im 22/114  soft-tissue]
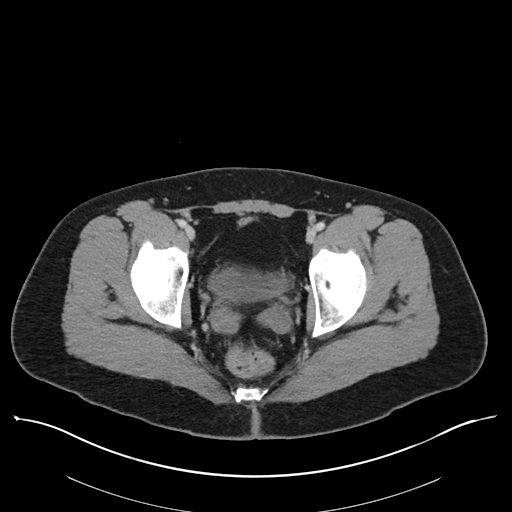
[im 31/114  soft-tissue]
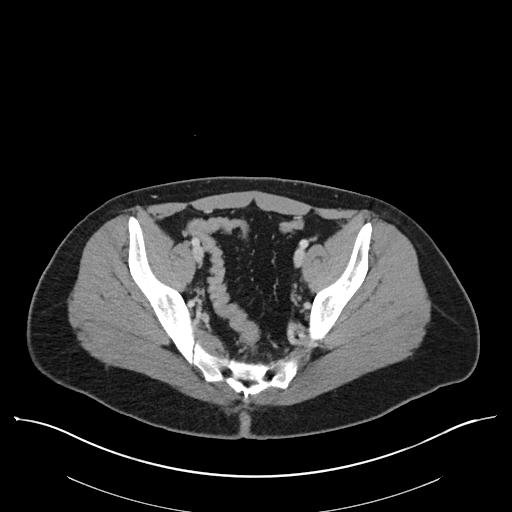
[im 40/114  soft-tissue]
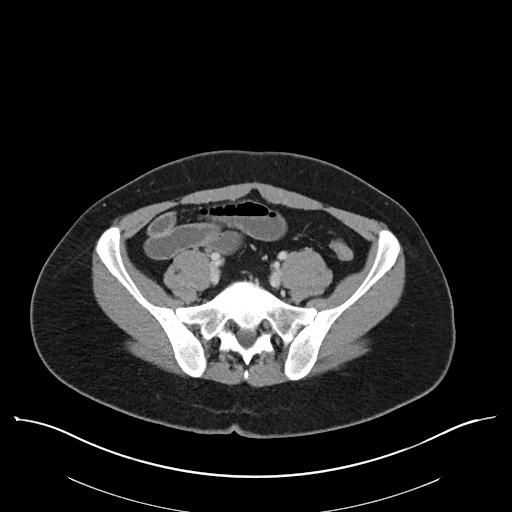
[im 44/114  soft-tissue]
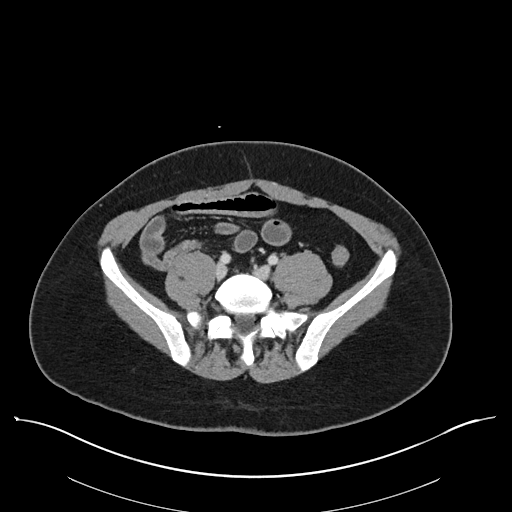
[im 53/114  soft-tissue]
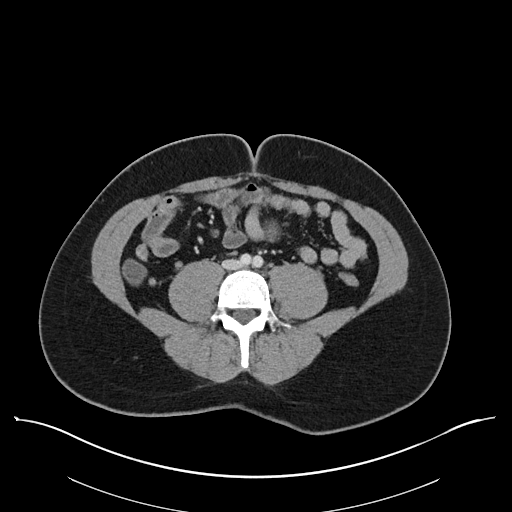
[im 61/114  soft-tissue]
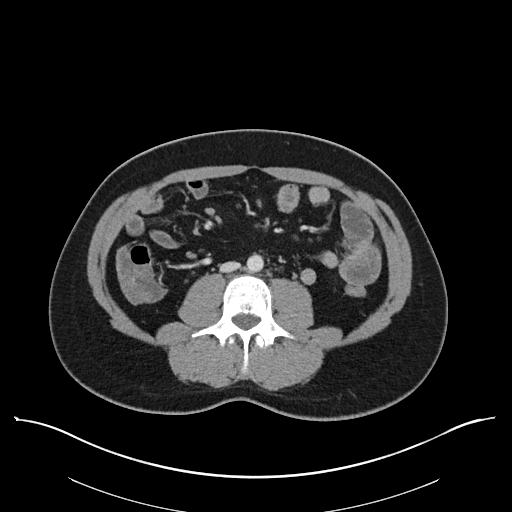
[im 70/114  soft-tissue]
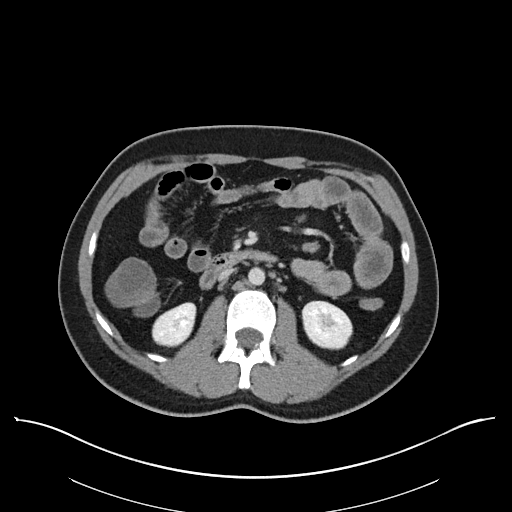
[im 70/114  bone]
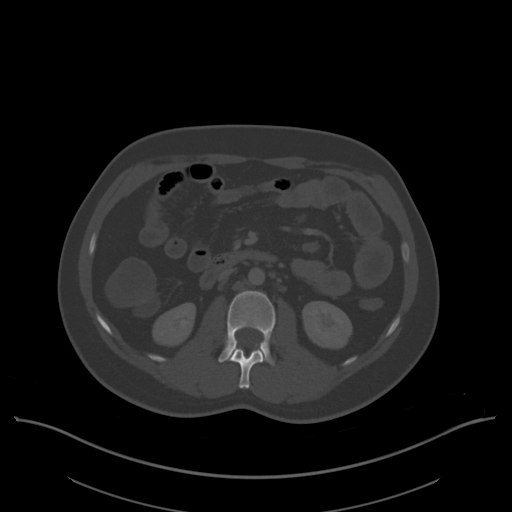
[im 74/114  soft-tissue]
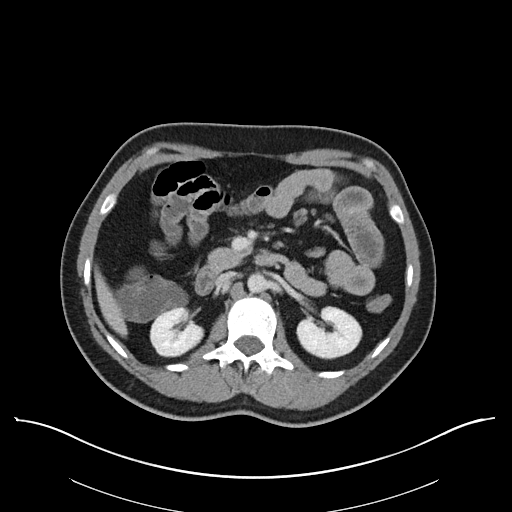
[im 83/114  soft-tissue]
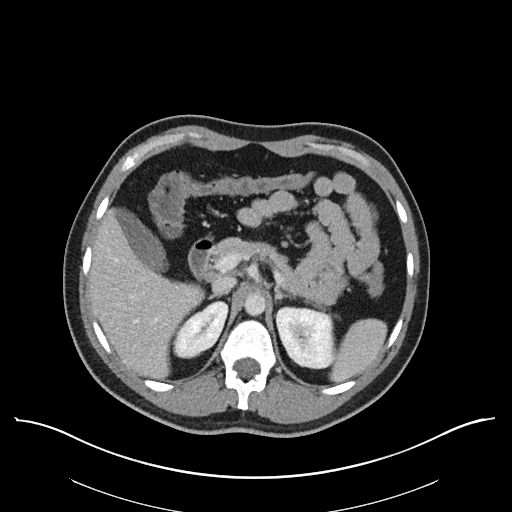
[im 92/114  soft-tissue]
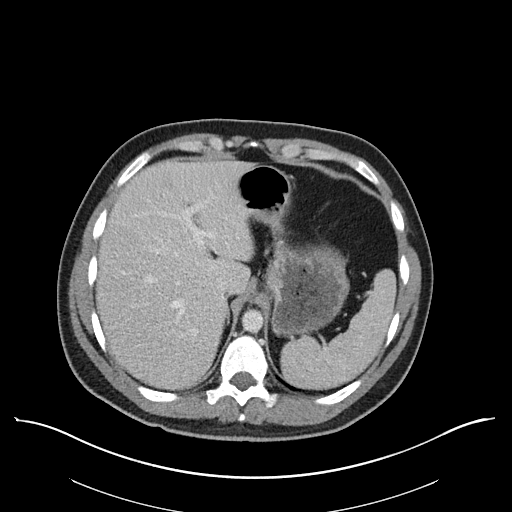
[im 100/114  soft-tissue]
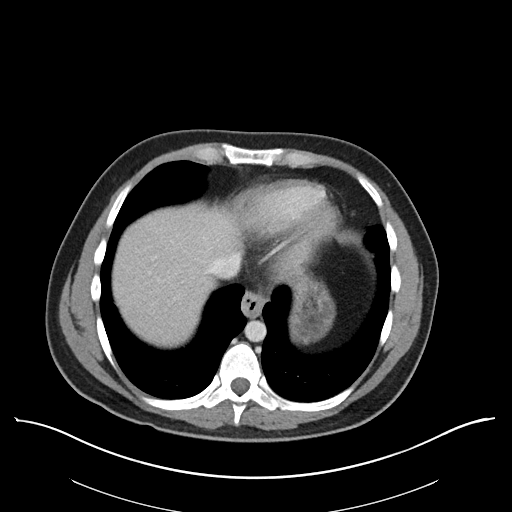
[im 109/114  soft-tissue]
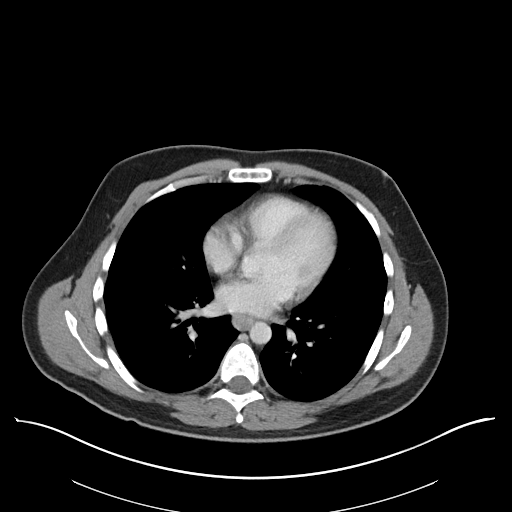

[Series 6: coronal soft tissue · coronal · 1.02mm/px · 3 of 127 slices shown]
[im 43/127  soft-tissue]
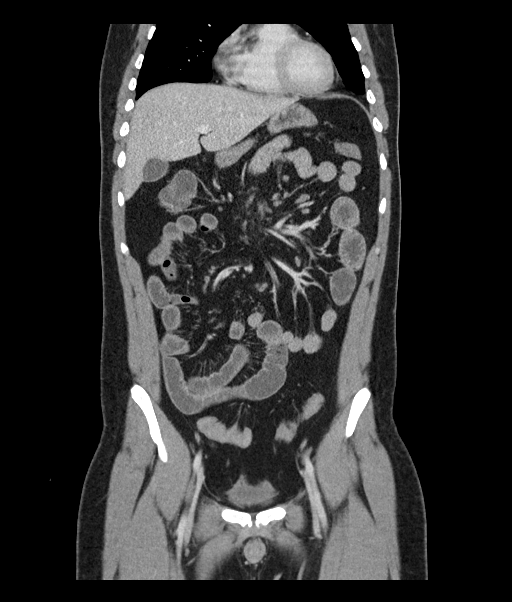
[im 57/127  soft-tissue]
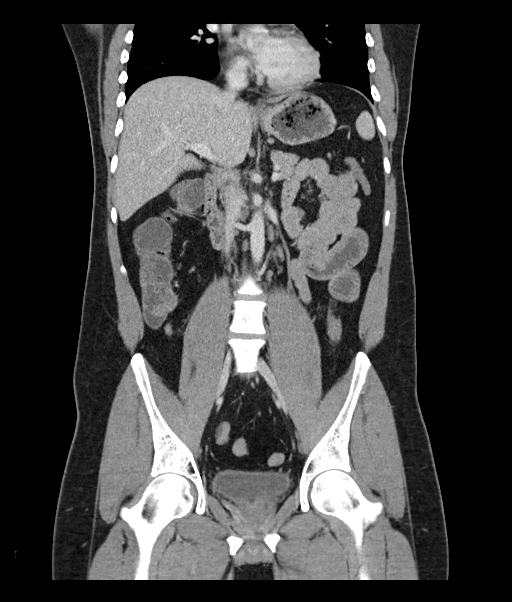
[im 71/127  soft-tissue]
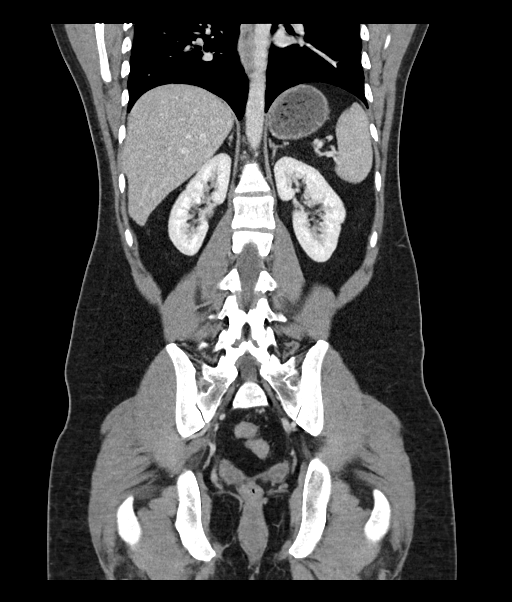

[17 of 46 positions shown; findings below may reference images not displayed]

FINDINGS: Lower chest: No acute abnormality.

Hepatobiliary: No focal liver abnormality is seen. No gallstones,
gallbladder wall thickening, or biliary dilatation.

Pancreas: Unremarkable. No pancreatic ductal dilatation or
surrounding inflammatory changes.

Spleen: Normal in size without focal abnormality.

Adrenals/Urinary Tract: Adrenal glands are unremarkable. Kidneys are
normal, without renal calculi, focal lesion, or hydronephrosis.
Bladder is unremarkable.

Stomach/Bowel: Stomach is within normal limits. Appendix appears
normal. No evidence of bowel wall thickening, distention, or
inflammatory changes.

Vascular/Lymphatic: No significant vascular findings are present. No
enlarged abdominal or pelvic lymph nodes.

Reproductive: Prostate is unremarkable.

Other: No abdominal wall hernia or abnormality. No abdominopelvic
ascites.

Musculoskeletal: No acute or significant osseous findings.
IMPRESSION: No abnormality seen in the abdomen or pelvis.

## 2021-02-21 ENCOUNTER — Emergency Department (HOSPITAL_COMMUNITY): Payer: PRIVATE HEALTH INSURANCE

## 2021-02-21 ENCOUNTER — Observation Stay (HOSPITAL_COMMUNITY)
Admission: EM | Admit: 2021-02-21 | Discharge: 2021-02-22 | Disposition: A | Discharge status: Home / Self Care | Payer: PRIVATE HEALTH INSURANCE | Attending: Internal Medicine | Admitting: Internal Medicine

## 2021-02-21 ENCOUNTER — Encounter (HOSPITAL_COMMUNITY): Payer: Self-pay

## 2021-02-21 DIAGNOSIS — G9340 Encephalopathy, unspecified: Secondary | ICD-10-CM

## 2021-02-21 DIAGNOSIS — E860 Dehydration: Secondary | ICD-10-CM

## 2021-02-21 DIAGNOSIS — R112 Nausea with vomiting, unspecified: Secondary | ICD-10-CM

## 2021-02-21 DIAGNOSIS — G929 Unspecified toxic encephalopathy: Secondary | ICD-10-CM | POA: Insufficient documentation

## 2021-02-21 DIAGNOSIS — Z20822 Contact with and (suspected) exposure to covid-19: Secondary | ICD-10-CM | POA: Insufficient documentation

## 2021-02-21 DIAGNOSIS — Z79899 Other long term (current) drug therapy: Secondary | ICD-10-CM | POA: Insufficient documentation

## 2021-02-21 DIAGNOSIS — R4182 Altered mental status, unspecified: Principal | ICD-10-CM | POA: Insufficient documentation

## 2021-02-21 DIAGNOSIS — R109 Unspecified abdominal pain: Secondary | ICD-10-CM | POA: Insufficient documentation

## 2021-02-21 DIAGNOSIS — Y900 Blood alcohol level of less than 20 mg/100 ml: Secondary | ICD-10-CM | POA: Insufficient documentation

## 2021-02-21 LAB — CBC WITH DIFFERENTIAL/PLATELET
Abs Immature Granulocytes: 0.03 10*3/uL (ref 0.00–0.07)
Basophils Absolute: 0 10*3/uL (ref 0.0–0.1)
Basophils Relative: 0 %
Eosinophils Absolute: 0.2 10*3/uL (ref 0.0–0.5)
Eosinophils Relative: 2 %
HCT: 42.8 % (ref 39.0–52.0)
Hemoglobin: 14.2 g/dL (ref 13.0–17.0)
Immature Granulocytes: 0 %
Lymphocytes Relative: 25 %
Lymphs Abs: 2 10*3/uL (ref 0.7–4.0)
MCH: 28.4 pg (ref 26.0–34.0)
MCHC: 33.2 g/dL (ref 30.0–36.0)
MCV: 85.6 fL (ref 80.0–100.0)
Monocytes Absolute: 0.5 10*3/uL (ref 0.1–1.0)
Monocytes Relative: 6 %
Neutro Abs: 5.4 10*3/uL (ref 1.7–7.7)
Neutrophils Relative %: 67 %
Platelets: 277 10*3/uL (ref 150–400)
RBC: 5 MIL/uL (ref 4.22–5.81)
RDW: 13.2 % (ref 11.5–15.5)
WBC: 8.1 10*3/uL (ref 4.0–10.5)
nRBC: 0 % (ref 0.0–0.2)

## 2021-02-21 LAB — COMPREHENSIVE METABOLIC PANEL
ALT: 28 U/L (ref 0–44)
AST: 24 U/L (ref 15–41)
Albumin: 4.6 g/dL (ref 3.5–5.0)
Alkaline Phosphatase: 78 U/L (ref 38–126)
Anion gap: 9 (ref 5–15)
BUN: 16 mg/dL (ref 6–20)
CO2: 26 mmol/L (ref 22–32)
Calcium: 9 mg/dL (ref 8.9–10.3)
Chloride: 104 mmol/L (ref 98–111)
Creatinine, Ser: 0.83 mg/dL (ref 0.61–1.24)
GFR, Estimated: >60 mL/min (ref >60)
Glucose, Bld: 142 mg/dL — ABNORMAL HIGH (ref 70–99)
Potassium: 3.7 mmol/L (ref 3.5–5.1)
Sodium: 139 mmol/L (ref 135–145)
Total Bilirubin: 0.4 mg/dL (ref 0.3–1.2)
Total Protein: 7.8 g/dL (ref 6.5–8.1)

## 2021-02-21 LAB — ACETAMINOPHEN LEVEL: Acetaminophen (Tylenol), Serum: <10 ug/mL — ABNORMAL LOW (ref 10–30)

## 2021-02-21 LAB — URINALYSIS, ROUTINE W REFLEX MICROSCOPIC
Bilirubin Urine: NEGATIVE
Glucose, UA: NEGATIVE mg/dL
Ketones, ur: NEGATIVE mg/dL
Leukocytes,Ua: NEGATIVE
Nitrite: NEGATIVE
Protein, ur: NEGATIVE mg/dL
Specific Gravity, Urine: >1.046 — ABNORMAL HIGH (ref 1.005–1.030)
pH: 7 (ref 5.0–8.0)

## 2021-02-21 LAB — RESP PANEL BY RT-PCR (FLU A&B, COVID) ARPGX2
Influenza A by PCR: NEGATIVE
Influenza B by PCR: NEGATIVE
SARS Coronavirus 2 by RT PCR: NEGATIVE

## 2021-02-21 LAB — RAPID URINE DRUG SCREEN, HOSP PERFORMED
Amphetamines: NOT DETECTED
Barbiturates: NOT DETECTED
Benzodiazepines: NOT DETECTED
Cocaine: NOT DETECTED
Opiates: NOT DETECTED
Tetrahydrocannabinol: POSITIVE — AB

## 2021-02-21 LAB — LACTIC ACID, PLASMA: Lactic Acid, Venous: 2.3 mmol/L (ref 0.5–1.9)

## 2021-02-21 LAB — SALICYLATE LEVEL: Salicylate Lvl: <7 mg/dL — ABNORMAL LOW (ref 7.0–30.0)

## 2021-02-21 LAB — ETHANOL: Alcohol, Ethyl (B): <10 mg/dL (ref <10)

## 2021-02-21 LAB — AMMONIA: Ammonia: 14 umol/L (ref 9–35)

## 2021-02-21 LAB — CBG MONITORING, ED: Glucose-Capillary: 109 mg/dL — ABNORMAL HIGH (ref 70–99)

## 2021-02-21 MED ORDER — SODIUM CHLORIDE 0.9 % IV SOLN
25.0000 mg | Freq: Once | INTRAVENOUS | Status: AC
  Administered 2021-02-21: 25 mg via INTRAVENOUS
  Filled 2021-02-21: qty 1

## 2021-02-21 MED ORDER — ENOXAPARIN SODIUM 40 MG/0.4ML IJ SOSY: 40.0000 mg | PREFILLED_SYRINGE | INTRAMUSCULAR | Status: DC

## 2021-02-21 MED ORDER — ONDANSETRON HCL 4 MG PO TABS: 4.0000 mg | ORAL_TABLET | Freq: Four times a day (QID) | ORAL | Status: DC | PRN

## 2021-02-21 MED ORDER — ONDANSETRON HCL 4 MG/2ML IJ SOLN
4.0000 mg | Freq: Once | INTRAMUSCULAR | Status: AC
  Administered 2021-02-21: 4 mg via INTRAVENOUS
  Filled 2021-02-21: qty 2

## 2021-02-21 MED ORDER — SODIUM CHLORIDE 0.9 % IV BOLUS
1000.0000 mL | Freq: Once | INTRAVENOUS | Status: AC
  Administered 2021-02-21: 1000 mL via INTRAVENOUS

## 2021-02-21 MED ORDER — ACETAMINOPHEN 650 MG RE SUPP: 650.0000 mg | Freq: Four times a day (QID) | RECTAL | Status: DC | PRN

## 2021-02-21 MED ORDER — IOHEXOL 350 MG/ML SOLN
80.0000 mL | Freq: Once | INTRAVENOUS | Status: AC | PRN
  Administered 2021-02-21: 80 mL via INTRAVENOUS

## 2021-02-21 MED ORDER — ACETAMINOPHEN 325 MG PO TABS: 650.0000 mg | ORAL_TABLET | Freq: Four times a day (QID) | ORAL | Status: DC | PRN

## 2021-02-21 MED ORDER — SODIUM CHLORIDE 0.9 % IV SOLN: INTRAVENOUS | Status: DC

## 2021-02-21 MED ORDER — ONDANSETRON HCL 4 MG/2ML IJ SOLN: 4.0000 mg | Freq: Four times a day (QID) | INTRAMUSCULAR | Status: DC | PRN

## 2021-02-21 NOTE — ED Triage Notes (Signed)
Pt BIB EMS. Pt coming from home, pts roommate called EMS, when EMS arrived pt was in doorway with vomit everywhere. Per roommate he was unsure what was going on with pt. Per EMS pt was pale and diaphoretic on arrival. Pt would not state his name nor would his roommate tell what the pts name is.   BP-120/80 HR-120 CBG-123 O2-95% RA

## 2021-02-21 NOTE — ED Provider Notes (Signed)
[facility]-EMERGENCY DEPT Provider Note   CSN: [id number] Arrival date & time: 02/21/21  1756     History Chief Complaint  Patient presents with   Ingestion    Jeff Mayo is a 30 y.o. male.  Pt presents to the ED today with AMS.  Pt is from home.  Pt arrived home and had projectile vomiting.  The room mate told EMS that he did not know anything about this patient and the room mate only knew his first name Jeff Mayo?).  Pt is unable to give any hx.         History reviewed. No pertinent past medical history.  Patient Active Problem List   Diagnosis Date Noted   Acute encephalopathy 02/21/2021        Family History  Problem Relation Age of Onset   Bipolar disorder Father        Home Medications Prior to Admission medications   Medication Sig Start Date End Date Taking? Authorizing Provider  Cholecalciferol (VITAMIN D3) 125 MCG (5000 UT) CAPS Take 5,000 Units by mouth daily.   Yes [provider]  Cyanocobalamin (VITAMIN B-12 PO) Take 1 tablet by mouth daily.   Yes [provider]    Allergies    Other and Penicillins  Review of Systems   Review of Systems  Unable to perform ROS: Mental status change   Physical Exam Updated Vital Signs BP 112/73   Pulse 75   Temp 97.8 F (36.6 C) (Axillary)   Resp 13   SpO2 100%   Physical Exam Vitals and nursing note reviewed.  Constitutional:      General: He is in acute distress.     Appearance: He is ill-appearing and diaphoretic.  HENT:     Head: Normocephalic and atraumatic.     Right Ear: External ear normal.     Left Ear: External ear normal.     Nose: Nose normal.     Mouth/Throat:     Mouth: Mucous membranes are dry.  Eyes:     Extraocular Movements: Extraocular movements intact.     Pupils: Pupils are equal, round, and reactive to light.  Cardiovascular:     Rate and Rhythm: Regular rhythm. Tachycardia present.     Pulses: Normal pulses.     Heart sounds:  Normal heart sounds.  Pulmonary:     Effort: Pulmonary effort is normal.     Breath sounds: Normal breath sounds.  Abdominal:     General: Abdomen is flat. Bowel sounds are normal.     Palpations: Abdomen is soft.  Musculoskeletal:        General: Normal range of motion.     Cervical back: Normal range of motion and neck supple.  Skin:    General: Skin is warm.     Capillary Refill: Capillary refill takes less than 2 seconds.  Neurological:     Mental Status: He is alert.     Comments: Pt will open his eyes, but will not follow other commands.  He told the nurse that he did not want to die.   He has not spoken to me.    ED Results / Procedures / Treatments   Labs (all labs ordered are listed, but only abnormal results are displayed) Labs Reviewed  COMPREHENSIVE METABOLIC PANEL - Abnormal; Notable for the following components:      Result Value   Glucose, Bld 142 (*)    All other components within normal limits  URINALYSIS, ROUTINE W  REFLEX MICROSCOPIC - Abnormal; Notable for the following components:   Specific Gravity, Urine >1.046 (*)    Hgb urine dipstick SMALL (*)    Bacteria, UA RARE (*)    All other components within normal limits  LACTIC ACID, PLASMA - Abnormal; Notable for the following components:   Lactic Acid, Venous 2.3 (*)    All other components within normal limits  RAPID URINE DRUG SCREEN, HOSP PERFORMED - Abnormal; Notable for the following components:   Tetrahydrocannabinol POSITIVE (*)    All other components within normal limits  ACETAMINOPHEN LEVEL - Abnormal; Notable for the following components:   Acetaminophen (Tylenol), Serum <10 (*)    All other components within normal limits  SALICYLATE LEVEL - Abnormal; Notable for the following components:   Salicylate Lvl <7.0 (*)    All other components within normal limits  CBG MONITORING, ED - Abnormal; Notable for the following components:   Glucose-Capillary 109 (*)    All other components within normal  limits  RESP PANEL BY RT-PCR (FLU A&B, COVID) [id number]  CBC WITH DIFFERENTIAL/PLATELET  AMMONIA  ETHANOL    EKG EKG Interpretation  Date/Time:  Monday February 21 2021 18:20:18 EDT Ventricular Rate:  116 PR Interval:  161 QRS Duration: 94 QT Interval:  319 QTC Calculation: 444 R Axis:   49 Text Interpretation: Sinus tachycardia No old tracing to compare Confirmed by Jacalyn Lefevre [id number]) on 02/21/2021 6:24:49 PM  Radiology CT ABDOMEN PELVIS W CONTRAST  Result Date: 02/21/2021 CLINICAL DATA:  Abdominal pain, acute, nonlocalized EXAM: CT ABDOMEN AND PELVIS WITH CONTRAST TECHNIQUE: Multidetector CT imaging of the abdomen and pelvis was performed using the standard protocol following bolus administration of intravenous contrast. CONTRAST:  84mL OMNIPAQUE IOHEXOL 350 MG/ML SOLN COMPARISON:  05/23/2018 FINDINGS: Lower chest: No acute abnormality. Hepatobiliary: No focal liver abnormality is seen. No gallstones, gallbladder wall thickening, or biliary dilatation. Pancreas: Unremarkable Spleen: Unremarkable Adrenals/Urinary Tract: Adrenal glands are unremarkable. Kidneys are normal, without renal calculi, focal lesion, or hydronephrosis. Bladder is unremarkable. Stomach/Bowel: Stomach is within normal limits. Appendix appears normal. No evidence of bowel wall thickening, distention, or inflammatory changes. No free intraperitoneal gas. Vascular/Lymphatic: No significant vascular findings are present. No enlarged abdominal or pelvic lymph nodes. Reproductive: Prostate is unremarkable. Other: No abdominal wall hernia or abnormality. No abdominopelvic ascites. Musculoskeletal: No acute bone abnormality. No lytic or blastic bone lesion. IMPRESSION: No acute intra-abdominal pathology identified. No definite radiographic explanation for the patient's reported symptoms. Electronically Signed   By: Helyn Numbers M.D.   On: 02/21/2021 20:59    Procedures Procedures   Medications Ordered in ED Medications   sodium chloride 0.9 % bolus 1,000 mL (0 mLs Intravenous Stopped 02/21/21 1950)  ondansetron (ZOFRAN) injection 4 mg (4 mg Intravenous Given 02/21/21 1826)  promethazine (PHENERGAN) 25 mg in sodium chloride 0.9 % 50 mL IVPB (0 mg Intravenous Stopped 02/21/21 2157)  iohexol (OMNIPAQUE) 350 MG/ML injection 80 mL (80 mLs Intravenous Contrast Given 02/21/21 2048)  sodium chloride 0.9 % bolus 1,000 mL (0 mLs Intravenous Stopped 02/21/21 2249)    ED Course  I have reviewed the triage vital signs and the nursing notes.  Pertinent labs & imaging results that were available during my care of the patient were reviewed by me and considered in my medical decision making (see chart for details).    MDM Rules/Calculators/A&P  Pt's room mate did tell pt's mom that pt came to the ED.  Pt's mom was able to give registration his name.    Mom said she spoke to pt earlier today and he was complaining of back pain.  Unfortunately, pt's mom decided that pt did not need a head CT, so she refused the head CT for patient.  I was not aware of this until after he had his abdomen CT'd with contrast and the CT tech asked me to cancel the head CT.  She also would not let the x-ray tech do a CXR.  Pt is still not talking much to mom.  She said he told her that he did not do drugs or hit his head.  He is still not talking to me.  Pt is still not at his baseline with continued n/v.  Pt d/w Dr. Allena Katz ([facility]) consulted for admission.  CRITICAL CARE Performed by: Jacalyn Lefevre   Total critical care time: 30 minutes  Critical care time was exclusive of separately billable procedures and treating other patients.  Critical care was necessary to treat or prevent imminent or life-threatening deterioration.  Critical care was time spent personally by me on the following activities: development of treatment plan with patient and/or surrogate as well as nursing, discussions with consultants,  evaluation of patient's response to treatment, examination of patient, obtaining history from patient or surrogate, ordering and performing treatments and interventions, ordering and review of laboratory studies, ordering and review of radiographic studies, pulse oximetry and re-evaluation of patient's condition.   Final Clinical Impression(s) / ED Diagnoses Final diagnoses:  Intractable nausea and vomiting  Dehydration  Acute encephalopathy    Rx / DC Orders ED Discharge Orders     None        Jacalyn Lefevre, MD 02/21/21 2310

## 2021-02-21 NOTE — H&P (Signed)
History and Physical    Jasyah Seney ONG:[id number] DOB: 1990/06/20 DOA: 02/21/2021  Attention: Patient has duplicate chart marked for merge under MRN [id number]  PCP: Pcp, No  Patient coming from: Home via EMS  I have personally briefly reviewed patient's old medical records in [facility]  Chief Complaint: Altered mental status  HPI: Leanard Lachman is a 30 y.o. male without known significant medical history who presented to the ED via EMS for evaluation of altered mental status.  Patient is unable to provide any history and is otherwise obtained from patient's mother at bedside, EDP, and chart review.  Patient's mother is at bedside who provides most of history as told to her by patient's roommates.  Patient had apparently spoke to his mother about experiencing back pain earlier today.  He tried yoga with his girlfriend without significant relief.  His mother recommended he go to chiropractor however he instead returned to his apartment where he resides with roommates.  Patient reportedly called out to his roommate and when seen he appeared pale, diaphoretic, and began to have projectile vomiting.  He was lethargic and EMS were subsequently called to their home.  Apparently patient's roommate did not know his full name and patient arrived as a "John Doe."  Patient's mother was eventually reached and came to the ED to provide further history.  She states that he has a childhood history of ADHD but currently does not take any medications regularly other than B12.  He has no other chronic medical conditions.  On my discussion with his mom, she states that she asked his roommates to look around the apartment to find anything he might have ingested.  They did report back that there was a bag of THC Gummies in the apartment and the suspicion is that patient ingested these, possibly to self medicate for her back pain.  Patient's mother does state that she she has an allergic reaction to  marijuana but does not know if her son has used it in the past.  ED Course:  Initial vitals showed BP 121/79, pulse 103, RR 19, temp 97.8 F, SPO2 98% on room air.  Labs show WBC 8.1, hemoglobin 14.2, platelets 277,000, sodium 139, potassium 3.7, bicarb 26, BUN 16, creatinine 0.83, serum glucose 142, LFTs within normal limits, ammonia 14, lactic acid 2.3.  Serum ethanol, acetaminophen, salicylate levels undetectable.  Urinalysis shows negative nitrites, negative leukocytes, 11-20 RBC/hpf, 0-5 WBC/hpf, rare bacteria on microscopy.  UDS is positive for THC.  SARS-CoV-2 PCR panel is in process.  CT abdomen/pelvis with contrast is negative for acute intra-abdominal pathology.  No radiographic explanation for patient's symptoms seen.  Portable chest x-ray and CT head without contrast were ordered however patient's mother apparently refused these tests while in the ED.  Patient was given 2 L normal saline, IV Zofran and Phenergan, and the hospitalist service was consulted to admit for further evaluation and management.  Review of Systems:  Unable to obtain full review of systems due to patient's encephalopathy.  History reviewed. No pertinent past medical history.  History reviewed. No pertinent surgical history.  Social History:  has no history on file for tobacco use, alcohol use, and drug use.  Allergies  Allergen Reactions   Other Hives, Diarrhea and Other (See Comments)    NO meds ending in -cillin!!!  = HIVES  [place] peppers = Hives, skin blisters, and diarrhea (per mother)   Penicillins Hives    Family History  Problem Relation Age of Onset  Bipolar disorder Father      Prior to Admission medications   Not on File    Physical Exam: Vitals:   02/21/21 2215 02/21/21 2300 02/21/21 2315 02/21/21 2330  BP: 112/73 106/61 112/67 106/62  Pulse: 75 69 71 69  Resp: 13 11 (!) 9 11  Temp:      TempSrc:      SpO2: 100% 96% 95% 95%   Constitutional: Lying supine  in bed, hypersomnolent, unarousable but winces and withdraws to noxious stimuli Eyes: PERRL, lids and conjunctivae normal ENMT: Mucous membranes are dry. Posterior pharynx clear of any exudate or lesions.Normal dentition.  Neck: normal, supple, no masses. Respiratory: clear to auscultation anteriorly.  Normal respiratory effort. No accessory muscle use.  Cardiovascular: Regular rate and rhythm, no murmurs / rubs / gallops. No extremity edema. 2+ pedal pulses. Abdomen: no obvious tenderness with palpation, no masses palpated. No hepatosplenomegaly. Bowel sounds positive.  Musculoskeletal: no clubbing / cyanosis. No joint deformity upper and lower extremities. No contractures. Normal muscle tone.  Skin: no rashes, lesions, ulcers. No induration Neurologic: Exam limited due to encephalopathy, not following commands.  Pupils equal round reactive to light.  Withdraws extremities to noxious stimuli.  Grimaces with sternal rub. Psychiatric: Hypersomnolent, not following commands or speaking.  Labs on Admission: I have personally reviewed following labs and imaging studies  CBC: Recent Labs  Lab 02/21/21 1847  WBC 8.1  NEUTROABS 5.4  HGB 14.2  HCT 42.8  MCV 85.6  PLT 277   Basic Metabolic Panel: Recent Labs  Lab 02/21/21 1847  NA 139  K 3.7  CL 104  CO2 26  GLUCOSE 142*  BUN 16  CREATININE 0.83  CALCIUM 9.0   GFR: CrCl cannot be calculated (Unknown ideal weight.). Liver Function Tests: Recent Labs  Lab 02/21/21 1847  AST 24  ALT 28  ALKPHOS 78  BILITOT 0.4  PROT 7.8  ALBUMIN 4.6   No results for input(s): LIPASE, AMYLASE in the last 168 hours. Recent Labs  Lab 02/21/21 1847  AMMONIA 14   Coagulation Profile: No results for input(s): INR, PROTIME in the last 168 hours. Cardiac Enzymes: No results for input(s): CKTOTAL, CKMB, CKMBINDEX, TROPONINI in the last 168 hours. BNP (last 3 results) No results for input(s): PROBNP in the last 8760 hours. HbA1C: No results  for input(s): HGBA1C in the last 72 hours. CBG: Recent Labs  Lab 02/21/21 1827  GLUCAP 109*   Lipid Profile: No results for input(s): CHOL, HDL, LDLCALC, TRIG, CHOLHDL, LDLDIRECT in the last 72 hours. Thyroid Function Tests: No results for input(s): TSH, T4TOTAL, FREET4, T3FREE, THYROIDAB in the last 72 hours. Anemia Panel: No results for input(s): VITAMINB12, FOLATE, FERRITIN, TIBC, IRON, RETICCTPCT in the last 72 hours. Urine analysis:    Component Value Date/Time   COLORURINE YELLOW 02/21/2021 2204   APPEARANCEUR CLEAR 02/21/2021 2204   LABSPEC >1.046 (H) 02/21/2021 2204   PHURINE 7.0 02/21/2021 2204   GLUCOSEU NEGATIVE 02/21/2021 2204   HGBUR SMALL (A) 02/21/2021 2204   BILIRUBINUR NEGATIVE 02/21/2021 2204   KETONESUR NEGATIVE 02/21/2021 2204   PROTEINUR NEGATIVE 02/21/2021 2204   NITRITE NEGATIVE 02/21/2021 2204   LEUKOCYTESUR NEGATIVE 02/21/2021 2204    Radiological Exams on Admission: CT ABDOMEN PELVIS W CONTRAST  Result Date: 02/21/2021 CLINICAL DATA:  Abdominal pain, acute, nonlocalized EXAM: CT ABDOMEN AND PELVIS WITH CONTRAST TECHNIQUE: Multidetector CT imaging of the abdomen and pelvis was performed using the standard protocol following bolus administration of intravenous contrast. CONTRAST:  45mL  OMNIPAQUE IOHEXOL 350 MG/ML SOLN COMPARISON:  05/23/2018 FINDINGS: Lower chest: No acute abnormality. Hepatobiliary: No focal liver abnormality is seen. No gallstones, gallbladder wall thickening, or biliary dilatation. Pancreas: Unremarkable Spleen: Unremarkable Adrenals/Urinary Tract: Adrenal glands are unremarkable. Kidneys are normal, without renal calculi, focal lesion, or hydronephrosis. Bladder is unremarkable. Stomach/Bowel: Stomach is within normal limits. Appendix appears normal. No evidence of bowel wall thickening, distention, or inflammatory changes. No free intraperitoneal gas. Vascular/Lymphatic: No significant vascular findings are present. No enlarged abdominal  or pelvic lymph nodes. Reproductive: Prostate is unremarkable. Other: No abdominal wall hernia or abnormality. No abdominopelvic ascites. Musculoskeletal: No acute bone abnormality. No lytic or blastic bone lesion. IMPRESSION: No acute intra-abdominal pathology identified. No definite radiographic explanation for the patient's reported symptoms. Electronically Signed   By: Helyn Numbers M.D.   On: 02/21/2021 20:59    EKG: Personally reviewed.  Sinus tachycardia, rate 116, no acute ischemic changes.  Rate is faster when compared to prior.  Assessment/Plan Principal Problem:   Acute encephalopathy   Roberts Bowdoin is a 30 y.o. male without known significant medical history who is admitted with acute encephalopathy.  Acute toxic encephalopathy: Suspect secondary to adverse reaction/intolerance from [facility] Gummies ingestion given secondhand history and UA positive for THC.  Labs show mildly elevated lactic acid otherwise reassuring.  CT abdomen/pelvis negative.  CT head and portable chest x-ray were ordered but not performed due to patient's mother's refusal.  Patient is still encephalopathic and not safe to return home at this time. -Continue IV fluid hydration overnight -Try to avoid sedating medications unless significantly agitated/combative/anxious  DVT prophylaxis: Lovenox Code Status: Full code Family Communication: Discussed with patient's mother at bedside Disposition Plan: From home and likely discharge to home pending improvement in encephalopathy Consults called: None Level of care: Med-Surg Admission status:   Status is: Observation  The patient remains OBS appropriate and will d/c before 2 midnights.  Darreld Mclean MD [facility]  If 7PM-7AM, please contact night-coverage [email or web address]  02/21/2021, 11:41 PM

## 2021-02-22 ENCOUNTER — Encounter (HOSPITAL_COMMUNITY): Payer: Self-pay | Admitting: Internal Medicine

## 2021-02-22 ENCOUNTER — Encounter (HOSPITAL_COMMUNITY): Payer: Self-pay | Admitting: Emergency Medicine

## 2021-02-22 LAB — BASIC METABOLIC PANEL
Anion gap: 9 (ref 5–15)
BUN: 11 mg/dL (ref 6–20)
CO2: 25 mmol/L (ref 22–32)
Calcium: 8.5 mg/dL — ABNORMAL LOW (ref 8.9–10.3)
Chloride: 106 mmol/L (ref 98–111)
Creatinine, Ser: 0.66 mg/dL (ref 0.61–1.24)
GFR, Estimated: >60 mL/min (ref >60)
Glucose, Bld: 100 mg/dL — ABNORMAL HIGH (ref 70–99)
Potassium: 3.9 mmol/L (ref 3.5–5.1)
Sodium: 140 mmol/L (ref 135–145)

## 2021-02-22 LAB — CBC
HCT: 38.3 % — ABNORMAL LOW (ref 39.0–52.0)
Hemoglobin: 12.7 g/dL — ABNORMAL LOW (ref 13.0–17.0)
MCH: 28.7 pg (ref 26.0–34.0)
MCHC: 33.2 g/dL (ref 30.0–36.0)
MCV: 86.5 fL (ref 80.0–100.0)
Platelets: 250 10*3/uL (ref 150–400)
RBC: 4.43 MIL/uL (ref 4.22–5.81)
RDW: 13.4 % (ref 11.5–15.5)
WBC: 9.5 10*3/uL (ref 4.0–10.5)
nRBC: 0 % (ref 0.0–0.2)

## 2021-02-22 LAB — LACTIC ACID, PLASMA: Lactic Acid, Venous: 1.3 mmol/L (ref 0.5–1.9)

## 2021-02-22 LAB — HIV ANTIBODY (ROUTINE TESTING W REFLEX): HIV Screen 4th Generation wRfx: NONREACTIVE

## 2021-02-22 NOTE — Discharge Summary (Signed)
Physician Discharge Summary  Jeff Mayo GSU:[id number] DOB: 04-Jun-1990 DOA: 02/21/2021  PCP: Pcp, No  Admit date: 02/21/2021 Discharge date: 02/22/2021  Admitted From: home  Disposition:  home   Discharge Condition:  stable   CODE STATUS:  Full code   Diet recommendation:  regular Consultations: none  Procedures/Studies: none   Discharge Diagnoses:  Principal Problem:   Acute toxic encephalopathy     Brief Summary: Jeff Mayo is a 30 y.o. male without known significant medical history who presented to the ED via EMS for evaluation of altered mental status.  Patient is unable to provide any history and is otherwise obtained from patient's mother at bedside, EDP, and chart review.  Patient's mother is at bedside who provides most of history as told to her by patient's roommates.  Patient had apparently spoke to his mother about experiencing back pain earlier today.  He tried yoga with his girlfriend without significant relief.  His mother recommended he go to chiropractor however he instead returned to his apartment where he resides with roommates.  Patient reportedly called out to his roommate and when seen he appeared pale, diaphoretic, and began to have projectile vomiting.  He was lethargic and EMS were subsequently called to their home.      Patient's mother was eventually reached and came to the ED to provide further history.  She states that he has a childhood history of ADHD but currently does not take any medications regularly other than B12.  He has no other chronic medical conditions.  On discussion with his mom, she states that she asked his roommates to look around the apartment to find anything he might have ingested.  They did report back that there was a bag of THC Gummies in the apartment and the suspicion is that patient ingested these, possibly to self medicate for her back pain.  Patient's mother does state that she she has an allergic reaction to marijuana  but does not know if her son has used it in the past.  Hospital Course:      Jeff Mayo is a 30 y.o. male without known significant medical history who is admitted with acute encephalopathy.   Acute toxic encephalopathy: Suspect secondary to adverse reaction/intolerance from [facility] Gummies ingestion given secondhand history and UA positive for THC.  Labs show mildly elevated lactic acid otherwise reassuring.  CT abdomen/pelvis negative.  CT head and portable chest x-ray were ordered but not performed due to patient's mother's refusal.  -  his mental status has improved today and he is stable to dc to home   Discharge Exam: Vitals:   02/22/21 0345 02/22/21 0500  BP: 95/62 113/71  Pulse: (!) 57 68  Resp: 16 13  Temp:    SpO2: 96% 100%   Vitals:   02/22/21 0245 02/22/21 0300 02/22/21 0345 02/22/21 0500  BP: (!) 103/55 (!) 132/116 95/62 113/71  Pulse: (!) 57 83 (!) 57 68  Resp: 10 19 16 13   Temp:      TempSrc:      SpO2: 96% 94% 96% 100%    General: Pt is alert, awake, not in acute distress Cardiovascular: RRR, S1/S2 +, no rubs, no gallops Respiratory: CTA bilaterally, no wheezing, no rhonchi Abdominal: Soft, NT, ND, bowel sounds + Extremities: no edema, no cyanosis   Discharge Instructions  Discharge Instructions     Diet - low sodium heart healthy   Complete by: As directed    Increase activity slowly   Complete by: As  directed       Allergies as of 02/22/2021       Reactions   Other Hives, Diarrhea, Other (See Comments)   NO meds ending in -cillin!!!  = HIVES [place] [employer] = Hives, skin blisters, and diarrhea (per mother)   Penicillins Hives        Medication List     TAKE these medications    VITAMIN B-12 PO Take 1 tablet by mouth daily.   Vitamin D3 125 MCG (5000 UT) Caps Take 5,000 Units by mouth daily.        Allergies  Allergen Reactions   Other Hives, Diarrhea and Other (See Comments)    NO meds ending in -cillin!!!  =  HIVES  [place] [employer] peppers = Hives, skin blisters, and diarrhea (per mother)   Penicillins Hives      CT ABDOMEN PELVIS W CONTRAST  Result Date: 02/21/2021 CLINICAL DATA:  Abdominal pain, acute, nonlocalized EXAM: CT ABDOMEN AND PELVIS WITH CONTRAST TECHNIQUE: Multidetector CT imaging of the abdomen and pelvis was performed using the standard protocol following bolus administration of intravenous contrast. CONTRAST:  38mL OMNIPAQUE IOHEXOL 350 MG/ML SOLN COMPARISON:  05/23/2018 FINDINGS: Lower chest: No acute abnormality. Hepatobiliary: No focal liver abnormality is seen. No gallstones, gallbladder wall thickening, or biliary dilatation. Pancreas: Unremarkable Spleen: Unremarkable Adrenals/Urinary Tract: Adrenal glands are unremarkable. Kidneys are normal, without renal calculi, focal lesion, or hydronephrosis. Bladder is unremarkable. Stomach/Bowel: Stomach is within normal limits. Appendix appears normal. No evidence of bowel wall thickening, distention, or inflammatory changes. No free intraperitoneal gas. Vascular/Lymphatic: No significant vascular findings are present. No enlarged abdominal or pelvic lymph nodes. Reproductive: Prostate is unremarkable. Other: No abdominal wall hernia or abnormality. No abdominopelvic ascites. Musculoskeletal: No acute bone abnormality. No lytic or blastic bone lesion. IMPRESSION: No acute intra-abdominal pathology identified. No definite radiographic explanation for the patient's reported symptoms. Electronically Signed   By: Helyn Numbers M.D.   On: 02/21/2021 20:59     The results of significant diagnostics from this hospitalization (including imaging, microbiology, ancillary and laboratory) are listed below for reference.     Microbiology: Recent Results (from the past 240 hour(s))  Resp Panel by RT-PCR (Flu A&B, Covid) Nasopharyngeal Swab     Status: None   Collection Time: 02/21/21  9:10 PM   Specimen: Nasopharyngeal Swab; Nasopharyngeal(NP) swabs  in vial transport medium  Result Value Ref Range Status   SARS Coronavirus 2 by RT PCR NEGATIVE NEGATIVE Final    Comment: (NOTE) SARS-CoV-2 target nucleic acids are NOT DETECTED.  The SARS-CoV-2 RNA is generally detectable in upper respiratory specimens during the acute phase of infection. The lowest concentration of SARS-CoV-2 viral copies this assay can detect is 138 copies/mL. A negative result does not preclude SARS-Cov-2 infection and should not be used as the sole basis for treatment or other patient management decisions. A negative result may occur with  improper specimen collection/handling, submission of specimen other than nasopharyngeal swab, presence of viral mutation(s) within the areas targeted by this assay, and inadequate number of viral copies(<138 copies/mL). A negative result must be combined with clinical observations, patient history, and epidemiological information. The expected result is Negative.  Fact Sheet for Patients:  BloggerCourse.com  Fact Sheet for Healthcare Providers:  SeriousBroker.it  This test is no t yet approved or cleared by the [place] FDA and  has been authorized for detection and/or diagnosis of SARS-CoV-2 by FDA under an Emergency Use Authorization (EUA). This EUA will remain  in effect (meaning this test can be used) for the duration of the COVID-19 declaration under Section 564(b)(1) of the Act, 21 U.S.C.section 360bbb-3(b)(1), unless the authorization is terminated  or revoked sooner.       Influenza A by PCR NEGATIVE NEGATIVE Final   Influenza B by PCR NEGATIVE NEGATIVE Final    Comment: (NOTE) The Xpert Xpress SARS-CoV-2/FLU/RSV plus assay is intended as an aid in the diagnosis of influenza from Nasopharyngeal swab specimens and should not be used as a sole basis for treatment. Nasal washings and aspirates are unacceptable for Xpert Xpress  SARS-CoV-2/FLU/RSV testing.  Fact Sheet for Patients: BloggerCourse.com  Fact Sheet for Healthcare Providers: SeriousBroker.it  This test is not yet approved or cleared by the [place] FDA and has been authorized for detection and/or diagnosis of SARS-CoV-2 by FDA under an Emergency Use Authorization (EUA). This EUA will remain in effect (meaning this test can be used) for the duration of the COVID-19 declaration under Section 564(b)(1) of the Act, 21 U.S.C. section 360bbb-3(b)(1), unless the authorization is terminated or revoked.  Performed at [facility], [address], [place], [place] [address]      Labs: BNP (last 3 results) No results for input(s): BNP in the last 8760 hours. Basic Metabolic Panel: Recent Labs  Lab 02/21/21 1847 02/22/21 0415  NA 139 140  K 3.7 3.9  CL 104 106  CO2 26 25  GLUCOSE 142* 100*  BUN 16 11  CREATININE 0.83 0.66  CALCIUM 9.0 8.5*   Liver Function Tests: Recent Labs  Lab 02/21/21 1847  AST 24  ALT 28  ALKPHOS 78  BILITOT 0.4  PROT 7.8  ALBUMIN 4.6   No results for input(s): LIPASE, AMYLASE in the last 168 hours. Recent Labs  Lab 02/21/21 1847  AMMONIA 14   CBC: Recent Labs  Lab 02/21/21 1847 02/22/21 0415  WBC 8.1 9.5  NEUTROABS 5.4  --   HGB 14.2 12.7*  HCT 42.8 38.3*  MCV 85.6 86.5  PLT 277 250   Cardiac Enzymes: No results for input(s): CKTOTAL, CKMB, CKMBINDEX, TROPONINI in the last 168 hours. BNP: Invalid input(s): POCBNP CBG: Recent Labs  Lab 02/21/21 1827  GLUCAP 109*   D-Dimer No results for input(s): DDIMER in the last 72 hours. Hgb A1c No results for input(s): HGBA1C in the last 72 hours. Lipid Profile No results for input(s): CHOL, HDL, LDLCALC, TRIG, CHOLHDL, LDLDIRECT in the last 72 hours. Thyroid function studies No results for input(s): TSH, T4TOTAL, T3FREE, THYROIDAB in the last 72 hours.  Invalid input(s):  FREET3 Anemia work up No results for input(s): VITAMINB12, FOLATE, FERRITIN, TIBC, IRON, RETICCTPCT in the last 72 hours. Urinalysis    Component Value Date/Time   COLORURINE YELLOW 02/21/2021 2204   APPEARANCEUR CLEAR 02/21/2021 2204   LABSPEC >1.046 (H) 02/21/2021 2204   PHURINE 7.0 02/21/2021 2204   GLUCOSEU NEGATIVE 02/21/2021 2204   HGBUR SMALL (A) 02/21/2021 2204   BILIRUBINUR NEGATIVE 02/21/2021 2204   KETONESUR NEGATIVE 02/21/2021 2204   PROTEINUR NEGATIVE 02/21/2021 2204   NITRITE NEGATIVE 02/21/2021 2204   LEUKOCYTESUR NEGATIVE 02/21/2021 2204   Sepsis Labs Invalid input(s): PROCALCITONIN,  WBC,  LACTICIDVEN Microbiology Recent Results (from the past 240 hour(s))  Resp Panel by RT-PCR (Flu A&B, Covid) Nasopharyngeal Swab     Status: None   Collection Time: 02/21/21  9:10 PM   Specimen: Nasopharyngeal Swab; Nasopharyngeal(NP) swabs in vial transport medium  Result Value Ref Range Status   SARS Coronavirus  2 by RT PCR NEGATIVE NEGATIVE Final    Comment: (NOTE) SARS-CoV-2 target nucleic acids are NOT DETECTED.  The SARS-CoV-2 RNA is generally detectable in upper respiratory specimens during the acute phase of infection. The lowest concentration of SARS-CoV-2 viral copies this assay can detect is 138 copies/mL. A negative result does not preclude SARS-Cov-2 infection and should not be used as the sole basis for treatment or other patient management decisions. A negative result may occur with  improper specimen collection/handling, submission of specimen other than nasopharyngeal swab, presence of viral mutation(s) within the areas targeted by this assay, and inadequate number of viral copies(<138 copies/mL). A negative result must be combined with clinical observations, patient history, and epidemiological information. The expected result is Negative.  Fact Sheet for Patients:  BloggerCourse.com  Fact Sheet for Healthcare Providers:   SeriousBroker.it  This test is no t yet approved or cleared by the [place] FDA and  has been authorized for detection and/or diagnosis of SARS-CoV-2 by FDA under an Emergency Use Authorization (EUA). This EUA will remain  in effect (meaning this test can be used) for the duration of the COVID-19 declaration under Section 564(b)(1) of the Act, 21 U.S.C.section 360bbb-3(b)(1), unless the authorization is terminated  or revoked sooner.       Influenza A by PCR NEGATIVE NEGATIVE Final   Influenza B by PCR NEGATIVE NEGATIVE Final    Comment: (NOTE) The Xpert Xpress SARS-CoV-2/FLU/RSV plus assay is intended as an aid in the diagnosis of influenza from Nasopharyngeal swab specimens and should not be used as a sole basis for treatment. Nasal washings and aspirates are unacceptable for Xpert Xpress SARS-CoV-2/FLU/RSV testing.  Fact Sheet for Patients: BloggerCourse.com  Fact Sheet for Healthcare Providers: SeriousBroker.it  This test is not yet approved or cleared by the [place] FDA and has been authorized for detection and/or diagnosis of SARS-CoV-2 by FDA under an Emergency Use Authorization (EUA). This EUA will remain in effect (meaning this test can be used) for the duration of the COVID-19 declaration under Section 564(b)(1) of the Act, 21 U.S.C. section 360bbb-3(b)(1), unless the authorization is terminated or revoked.  Performed at [facility], [address] [place], [place] [address]      Time coordinating discharge in minutes: 65  SIGNED:   Calvert Cantor, MD  [facility] 02/22/2021, 8:03 AM
